# Patient Record
Sex: Female | Born: 1985 | Race: Black or African American | Hispanic: No | Smoking: Former smoker
Health system: Southern US, Community
[De-identification: ages and names within clinical notes are randomized; demographics above are authoritative.]

## PROBLEM LIST (undated history)

## (undated) DIAGNOSIS — D649 Anemia, unspecified: Secondary | ICD-10-CM

## (undated) DIAGNOSIS — F329 Major depressive disorder, single episode, unspecified: Secondary | ICD-10-CM

## (undated) DIAGNOSIS — R000 Tachycardia, unspecified: Secondary | ICD-10-CM

## (undated) DIAGNOSIS — O24419 Gestational diabetes mellitus in pregnancy, unspecified control: Secondary | ICD-10-CM

## (undated) DIAGNOSIS — F419 Anxiety disorder, unspecified: Secondary | ICD-10-CM

## (undated) DIAGNOSIS — D473 Essential (hemorrhagic) thrombocythemia: Secondary | ICD-10-CM

## (undated) DIAGNOSIS — F32A Depression, unspecified: Secondary | ICD-10-CM

## (undated) DIAGNOSIS — R7303 Prediabetes: Secondary | ICD-10-CM

## (undated) HISTORY — DX: Major depressive disorder, single episode, unspecified: F32.9

## (undated) HISTORY — PX: WISDOM TOOTH EXTRACTION: SHX21

## (undated) HISTORY — DX: Essential (hemorrhagic) thrombocythemia: D47.3

## (undated) HISTORY — DX: Anemia, unspecified: D64.9

## (undated) HISTORY — DX: Depression, unspecified: F32.A

## (undated) HISTORY — DX: Anxiety disorder, unspecified: F41.9

---

## 2006-01-08 ENCOUNTER — Encounter: Admission: RE | Admit: 2006-01-08 | Discharge: 2006-01-08 | Payer: Self-pay | Admitting: Family Medicine

## 2006-08-30 ENCOUNTER — Emergency Department (HOSPITAL_COMMUNITY): Admission: EM | Admit: 2006-08-30 | Discharge: 2006-08-30 | Payer: Self-pay | Admitting: Emergency Medicine

## 2006-12-16 ENCOUNTER — Other Ambulatory Visit: Admission: RE | Admit: 2006-12-16 | Discharge: 2006-12-16 | Payer: Self-pay | Admitting: Obstetrics and Gynecology

## 2007-05-12 ENCOUNTER — Ambulatory Visit (HOSPITAL_COMMUNITY): Admission: RE | Admit: 2007-05-12 | Discharge: 2007-05-12 | Payer: Self-pay | Admitting: Obstetrics and Gynecology

## 2010-08-19 ENCOUNTER — Other Ambulatory Visit: Admission: RE | Admit: 2010-08-19 | Discharge: 2010-08-19 | Payer: Self-pay | Admitting: Family Medicine

## 2011-01-29 ENCOUNTER — Emergency Department (HOSPITAL_COMMUNITY)
Admission: EM | Admit: 2011-01-29 | Discharge: 2011-01-29 | Discharge status: Against Medical Advice | Payer: BC Managed Care – PPO | Attending: Emergency Medicine | Admitting: Emergency Medicine

## 2011-01-29 DIAGNOSIS — R220 Localized swelling, mass and lump, head: Secondary | ICD-10-CM | POA: Insufficient documentation

## 2011-01-29 DIAGNOSIS — R221 Localized swelling, mass and lump, neck: Secondary | ICD-10-CM | POA: Insufficient documentation

## 2011-01-29 DIAGNOSIS — K089 Disorder of teeth and supporting structures, unspecified: Secondary | ICD-10-CM | POA: Insufficient documentation

## 2013-10-31 ENCOUNTER — Other Ambulatory Visit: Payer: Self-pay | Admitting: Physician Assistant

## 2013-10-31 ENCOUNTER — Other Ambulatory Visit (HOSPITAL_COMMUNITY)
Admission: RE | Admit: 2013-10-31 | Discharge: 2013-10-31 | Disposition: A | Discharge status: Home / Self Care | Payer: BC Managed Care – PPO | Source: Ambulatory Visit | Attending: Family Medicine | Admitting: Family Medicine

## 2013-10-31 DIAGNOSIS — Z124 Encounter for screening for malignant neoplasm of cervix: Secondary | ICD-10-CM | POA: Insufficient documentation

## 2013-10-31 DIAGNOSIS — N63 Unspecified lump in unspecified breast: Secondary | ICD-10-CM

## 2013-11-03 ENCOUNTER — Other Ambulatory Visit: Payer: BC Managed Care – PPO

## 2013-11-07 ENCOUNTER — Other Ambulatory Visit: Payer: BC Managed Care – PPO

## 2013-11-15 ENCOUNTER — Other Ambulatory Visit: Payer: BC Managed Care – PPO

## 2015-02-27 ENCOUNTER — Encounter (HOSPITAL_BASED_OUTPATIENT_CLINIC_OR_DEPARTMENT_OTHER): Payer: Self-pay | Admitting: *Deleted

## 2015-02-27 ENCOUNTER — Emergency Department (HOSPITAL_BASED_OUTPATIENT_CLINIC_OR_DEPARTMENT_OTHER)
Admission: EM | Admit: 2015-02-27 | Discharge: 2015-02-27 | Disposition: A | Discharge status: Home / Self Care | Payer: BC Managed Care – PPO | Attending: Emergency Medicine | Admitting: Emergency Medicine

## 2015-02-27 DIAGNOSIS — L259 Unspecified contact dermatitis, unspecified cause: Secondary | ICD-10-CM | POA: Insufficient documentation

## 2015-02-27 DIAGNOSIS — Z792 Long term (current) use of antibiotics: Secondary | ICD-10-CM | POA: Insufficient documentation

## 2015-02-27 MED ORDER — DEXAMETHASONE SODIUM PHOSPHATE 10 MG/ML IJ SOLN
10.0000 mg | Freq: Once | INTRAMUSCULAR | Status: AC
  Administered 2015-02-27: 10 mg via INTRAMUSCULAR
  Filled 2015-02-27: qty 1

## 2015-02-27 NOTE — Discharge Instructions (Signed)
Contact Dermatitis °Contact dermatitis is a rash that happens when something touches the skin. You touched something that irritates your skin, or you have allergies to something you touched. °HOME CARE  °· Avoid the thing that caused your rash. °· Keep your rash away from hot water, soap, sunlight, chemicals, and other things that might bother it. °· Do not scratch your rash. °· You can take cool baths to help stop itching. °· Only take medicine as told by your doctor. °· Keep all doctor visits as told. °GET HELP RIGHT AWAY IF:  °· Your rash is not better after 3 days. °· Your rash gets worse. °· Your rash is puffy (swollen), tender, red, sore, or warm. °· You have problems with your medicine. °MAKE SURE YOU:  °· Understand these instructions. °· Will watch your condition. °· Will get help right away if you are not doing well or get worse. °Document Released: 08/31/2009 Document Revised: 01/26/2012 Document Reviewed: 04/08/2011 °ExitCare® Patient Information ©2015 [employer]. This information is not intended to replace advice given to you by your health care provider. Make sure you discuss any questions you have with your health care provider. ° °

## 2015-02-27 NOTE — ED Notes (Signed)
Pt reports she used restroom at her place of work this morning after it had been cleaned- area of skin that came in contact with toilet seat broke out in rash, redness, and burning- pt consulted her PCP and went home and showered as directed but skin is still burning

## 2015-02-27 NOTE — ED Provider Notes (Signed)
CSN: [id number]     Arrival date & time 02/27/15  2023 History  This chart was scribed for Orpah Greek, MD by Tula Nakayama, ED Scribe. This patient was seen in room MH12/MH12 and the patient's care was started at 8:52 PM.    Chief Complaint  Patient presents with  . Chemical Exposure   The history is provided by the patient. No language interpreter was used.    HPI Comments: Kathryn Velazquez is a 29 y.o. female who presents to the Emergency Department complaining of a constant, moderate, red, burning and itching rash on her buttocks that started this morning. Pt reports that onset of symptoms started after she used the restroom at work and was exposed to the cleaning chemicals on the toilet. Pt tried showering and Benadryl with some relief to the itching. She denies numbness.  History reviewed. No pertinent past medical history. Past Surgical History  Procedure Laterality Date  . Cesarean section     No family history on file. History  Substance Use Topics  . Smoking status: Never Smoker   . Smokeless tobacco: Never Used  . Alcohol Use: Yes     Comment: rare   OB History    No data available     Review of Systems  Skin: Positive for rash.  Neurological: Negative for numbness.  All other systems reviewed and are negative.   Allergies  Review of patient's allergies indicates no known allergies.  Home Medications   Prior to Admission medications   Medication Sig Start Date End Date Taking? Authorizing Provider  amoxicillin (AMOXIL) 500 MG tablet Take 500 mg by mouth 3 (three) times daily.   Yes Historical Provider, MD   BP 149/83 mmHg  Pulse 99  Temp(Src) 98.3 F (36.8 C) (Oral)  Resp 16  Ht 5\' 1"  (1.549 m)  Wt 240 lb (108.863 kg)  BMI 45.37 kg/m2  SpO2 100%  LMP 02/04/2015 Physical Exam  Constitutional: She is oriented to person, place, and time. She appears well-developed and well-nourished. No distress.  HENT:  Head: Normocephalic and atraumatic.   Right Ear: Hearing normal.  Left Ear: Hearing normal.  Nose: Nose normal.  Mouth/Throat: Oropharynx is clear and moist and mucous membranes are normal.  Eyes: Conjunctivae and EOM are normal. Pupils are equal, round, and reactive to light.  Neck: Normal range of motion. Neck supple.  Cardiovascular: Regular rhythm, S1 normal and S2 normal.  Exam reveals no gallop and no friction rub.   No murmur heard. Pulmonary/Chest: Effort normal and breath sounds normal. No respiratory distress. She exhibits no tenderness.  Abdominal: Soft. Normal appearance and bowel sounds are normal. There is no hepatosplenomegaly. There is no tenderness. There is no rebound, no guarding, no tenderness at McBurney's point and negative Murphy's sign. No hernia.  Musculoskeletal: Normal range of motion.  Neurological: She is alert and oriented to person, place, and time. She has normal strength. No cranial nerve deficit or sensory deficit. Coordination normal. GCS eye subscore is 4. GCS verbal subscore is 5. GCS motor subscore is 6.  Skin: Skin is warm, dry and intact. No rash noted. No cyanosis.  Psychiatric: She has a normal mood and affect. Her speech is normal and behavior is normal. Thought content normal.  Nursing note and vitals reviewed.   ED Course  Procedures   DIAGNOSTIC STUDIES: Oxygen Saturation is 100% on RA, normal by my interpretation.    COORDINATION OF CARE: 8:55 PM Discussed treatment plan with pt which includes a Decadron  injection. Pt agreed to plan.   Labs Review Labs Reviewed - No data to display  Imaging Review No results found.   EKG Interpretation None      MDM   Final diagnoses:  None   Patient presents to the ER for evaluation of exposure to a cleaning product. Patient reports that she has been experiencing itching and burning on the skin and attached the toilet seat that she set on at work earlier that had cleaning solution on it. Her skin examination, however, is normal.  I do not see any evidence of chemical burns in no outward signs of allergic reaction. Patient was reassured. She can continue Benadryl and was given Decadron IM for treatment of possible mild allergic reaction.  I personally performed the services described in this documentation, which was scribed in my presence. The recorded information has been reviewed and is accurate.    Orpah Greek, MD 02/27/15 2107

## 2016-03-12 ENCOUNTER — Other Ambulatory Visit: Payer: Self-pay | Admitting: Physician Assistant

## 2016-03-12 ENCOUNTER — Other Ambulatory Visit (HOSPITAL_COMMUNITY)
Admission: RE | Admit: 2016-03-12 | Discharge: 2016-03-12 | Disposition: A | Discharge status: Home / Self Care | Payer: BC Managed Care – PPO | Source: Ambulatory Visit | Attending: Family Medicine | Admitting: Family Medicine

## 2016-03-12 DIAGNOSIS — Z124 Encounter for screening for malignant neoplasm of cervix: Secondary | ICD-10-CM | POA: Diagnosis not present

## 2016-03-12 DIAGNOSIS — N6310 Unspecified lump in the right breast, unspecified quadrant: Secondary | ICD-10-CM

## 2016-03-13 LAB — HM PAP SMEAR: HM Pap smear: NEGATIVE

## 2016-03-17 LAB — CYTOLOGY - PAP

## 2016-03-18 ENCOUNTER — Inpatient Hospital Stay: Admission: RE | Admit: 2016-03-18 | Payer: Self-pay | Source: Ambulatory Visit

## 2016-03-31 ENCOUNTER — Other Ambulatory Visit: Payer: Self-pay

## 2016-12-11 ENCOUNTER — Ambulatory Visit
Admission: RE | Admit: 2016-12-11 | Discharge: 2016-12-11 | Disposition: A | Discharge status: Home / Self Care | Payer: BC Managed Care – PPO | Source: Ambulatory Visit | Attending: Physician Assistant | Admitting: Physician Assistant

## 2016-12-11 ENCOUNTER — Other Ambulatory Visit: Payer: Self-pay | Admitting: Physician Assistant

## 2016-12-11 DIAGNOSIS — N6310 Unspecified lump in the right breast, unspecified quadrant: Secondary | ICD-10-CM

## 2016-12-15 ENCOUNTER — Other Ambulatory Visit: Payer: Self-pay | Admitting: Physician Assistant

## 2016-12-15 ENCOUNTER — Inpatient Hospital Stay: Admission: RE | Admit: 2016-12-15 | Payer: Self-pay | Source: Ambulatory Visit

## 2016-12-15 DIAGNOSIS — N6310 Unspecified lump in the right breast, unspecified quadrant: Secondary | ICD-10-CM

## 2016-12-26 ENCOUNTER — Ambulatory Visit
Admission: RE | Admit: 2016-12-26 | Discharge: 2016-12-26 | Disposition: A | Discharge status: Home / Self Care | Payer: BC Managed Care – PPO | Source: Ambulatory Visit | Attending: Physician Assistant | Admitting: Physician Assistant

## 2016-12-26 DIAGNOSIS — N6310 Unspecified lump in the right breast, unspecified quadrant: Secondary | ICD-10-CM

## 2017-02-19 ENCOUNTER — Emergency Department (HOSPITAL_COMMUNITY)
Admission: EM | Admit: 2017-02-19 | Discharge: 2017-02-19 | Disposition: A | Discharge status: Home / Self Care | Payer: BC Managed Care – PPO | Attending: Emergency Medicine | Admitting: Emergency Medicine

## 2017-02-19 ENCOUNTER — Encounter (HOSPITAL_COMMUNITY): Payer: Self-pay | Admitting: Emergency Medicine

## 2017-02-19 DIAGNOSIS — T7840XA Allergy, unspecified, initial encounter: Secondary | ICD-10-CM | POA: Diagnosis present

## 2017-02-19 DIAGNOSIS — R21 Rash and other nonspecific skin eruption: Secondary | ICD-10-CM | POA: Diagnosis not present

## 2017-02-19 MED ORDER — DIPHENHYDRAMINE HCL 50 MG/ML IJ SOLN
25.0000 mg | Freq: Once | INTRAMUSCULAR | Status: AC
  Administered 2017-02-19: 25 mg via INTRAVENOUS
  Filled 2017-02-19: qty 1

## 2017-02-19 MED ORDER — DEXAMETHASONE SODIUM PHOSPHATE 10 MG/ML IJ SOLN
10.0000 mg | Freq: Once | INTRAMUSCULAR | Status: AC
  Administered 2017-02-19: 10 mg via INTRAVENOUS
  Filled 2017-02-19: qty 1

## 2017-02-19 MED ORDER — FAMOTIDINE IN NACL 20-0.9 MG/50ML-% IV SOLN
20.0000 mg | Freq: Once | INTRAVENOUS | Status: AC
  Administered 2017-02-19: 20 mg via INTRAVENOUS
  Filled 2017-02-19: qty 50

## 2017-02-19 MED ORDER — FAMOTIDINE 20 MG PO TABS: 20.0000 mg | ORAL_TABLET | Freq: Two times a day (BID) | ORAL | 0 refills | Status: DC

## 2017-02-19 MED ORDER — EPINEPHRINE 0.3 MG/0.3ML IJ SOAJ: 0.3000 mg | Freq: Once | INTRAMUSCULAR | 2 refills | Status: AC

## 2017-02-19 MED ORDER — PREDNISONE 50 MG PO TABS: 50.0000 mg | ORAL_TABLET | Freq: Every day | ORAL | 0 refills | Status: DC

## 2017-02-19 MED ORDER — SODIUM CHLORIDE 0.9 % IV BOLUS (SEPSIS)
1000.0000 mL | Freq: Once | INTRAVENOUS | Status: AC
  Administered 2017-02-19: 1000 mL via INTRAVENOUS

## 2017-02-19 NOTE — ED Notes (Signed)
Reassed vitals.Pt stated her throat was sore and it was becoming difficult to swallow.  RN notified and Pt taken to triage room to be reassessed.

## 2017-02-19 NOTE — ED Notes (Signed)
Pt reports continued throat discomfort, reports tightness to both hands, hives noted to arms and legs.

## 2017-02-19 NOTE — Discharge Instructions (Signed)
Return here for any worsening in your condition.  If you start having any throat tightening, tongue swelling, you will need to return to the emergency department

## 2017-02-19 NOTE — ED Triage Notes (Signed)
Pt st's she woke up yesterday with hives on her body.  St's today she went to her MD and received a shot of steroids and benadryl.  Pt st's after getting home her lips began to swell and hives became worse.  No resp distress at this time

## 2017-02-25 NOTE — ED Provider Notes (Signed)
[facility] DEPT Provider Note   CSN: [id number] Arrival date & time: 02/19/17  1551     History   Chief Complaint Chief Complaint  Patient presents with  . Allergic Reaction    HPI Kathryn Velazquez is a 31 y.o. female.  HPI Patient presents to the emergency department with hives on her legs and arms and chest.  The patient states that she went to her doctor, received Benadryl and a shot of steroids.  Patient states that she noticed that her lips and hands were swelling.  The patient states that she did not have any trouble breathing or difficulty swallowing.  The patient states nothing seems make the condition better or worse. The patient denies chest pain, shortness of breath, headache,blurred vision, neck pain, fever, cough, weakness, numbness, dizziness, anorexia, edema, abdominal pain, nausea, vomiting, diarrhea, rash, back pain, dysuria, hematemesis, bloody stool, near syncope, or syncope. History reviewed. No pertinent past medical history.  There are no active problems to display for this patient.   Past Surgical History:  Procedure Laterality Date  . CESAREAN SECTION      OB History    No data available       Home Medications    Prior to Admission medications   Medication Sig Start Date End Date Taking? Authorizing Provider  Acetaminophen-Caffeine (EXCEDRIN TENSION HEADACHE) 500-65 MG TABS Take 1 tablet by mouth daily as needed (HEADACHE).   Yes Historical Provider, MD  diphenhydrAMINE (BENADRYL) 25 MG tablet Take 50 mg by mouth every 6 (six) hours as needed for allergies.    Yes Historical Provider, MD  escitalopram (LEXAPRO) 10 MG tablet Take 10 mg by mouth daily. 02/03/17  Yes Historical Provider, MD  naproxen sodium (ANAPROX) 220 MG tablet Take 220 mg by mouth 2 (two) times daily as needed (FOR PAIN).   Yes Historical Provider, MD  famotidine (PEPCID) 20 MG tablet Take 1 tablet (20 mg total) by mouth 2 (two) times daily. 02/19/17   Dalia Heading, PA-C    predniSONE (DELTASONE) 50 MG tablet Take 1 tablet (50 mg total) by mouth daily. 02/19/17   Dalia Heading, PA-C    Family History No family history on file.  Social History Social History  Substance Use Topics  . Smoking status: Never Smoker  . Smokeless tobacco: Never Used  . Alcohol use Yes     Comment: rare     Allergies   Patient has no known allergies.   Review of Systems Review of Systems  All other systems negative except as documented in the HPI. All pertinent positives and negatives as reviewed in the HPI. Physical Exam Updated Vital Signs BP (!) 108/50   Pulse 80   Temp 97.8 F (36.6 C) (Oral)   Resp 20   Ht 5\' 1"  (1.549 m)   Wt 101.2 kg   LMP 02/15/2017 (Exact Date)   SpO2 100%   BMI 42.14 kg/m   Physical Exam  Constitutional: She is oriented to person, place, and time. She appears well-developed and well-nourished. No distress.  HENT:  Head: Normocephalic and atraumatic.    Mouth/Throat: Oropharynx is clear and moist.  Eyes: Pupils are equal, round, and reactive to light.  Neck: Normal range of motion. Neck supple.  Cardiovascular: Normal rate, regular rhythm and normal heart sounds.  Exam reveals no gallop and no friction rub.   No murmur heard. Pulmonary/Chest: Effort normal and breath sounds normal. No respiratory distress. She has no wheezes.  Abdominal: Soft. Bowel sounds are normal. She exhibits  no distension. There is no tenderness.  Neurological: She is alert and oriented to person, place, and time. She exhibits normal muscle tone. Coordination normal.  Skin: Skin is warm and dry. Capillary refill takes less than 2 seconds. No rash noted. No erythema.  Psychiatric: She has a normal mood and affect. Her behavior is normal.  Nursing note and vitals reviewed.    ED Treatments / Results  Labs (all labs ordered are listed, but only abnormal results are displayed) Labs Reviewed - No data to display  EKG  EKG Interpretation None        Radiology No results found.  Procedures Procedures (including critical care time)  Medications Ordered in ED Medications  sodium chloride 0.9 % bolus 1,000 mL (0 mLs Intravenous Stopped 02/19/17 2203)  diphenhydrAMINE (BENADRYL) injection 25 mg (25 mg Intravenous Given 02/19/17 1850)  famotidine (PEPCID) IVPB 20 mg premix (0 mg Intravenous Stopped 02/19/17 2203)  dexamethasone (DECADRON) injection 10 mg (10 mg Intravenous Given 02/19/17 1904)     Initial Impression / Assessment and Plan / ED Course  I have reviewed the triage vital signs and the nursing notes.  Pertinent labs & imaging results that were available during my care of the patient were reviewed by me and considered in my medical decision making (see chart for details).     Patient has been observed over many hours here in the emergency department.  She is improved her hives did not completely resolve but it mostly resolved.  Advised the patient the plan and all questions were answered.  Patient lip swelling has improved as well.  Her hands are nonswollen  Final Clinical Impressions(s) / ED Diagnoses   Final diagnoses:  Allergic reaction, initial encounter  Rash    New Prescriptions Discharge Medication List as of 02/19/2017 11:19 PM    START taking these medications   Details  EPINEPHrine 0.3 mg/0.3 mL IJ SOAJ injection Inject 0.3 mLs (0.3 mg total) into the muscle once., Starting Thu 02/19/2017, Print    famotidine (PEPCID) 20 MG tablet Take 1 tablet (20 mg total) by mouth 2 (two) times daily., Starting Thu 02/19/2017, Print    predniSONE (DELTASONE) 50 MG tablet Take 1 tablet (50 mg total) by mouth daily., Starting Thu 02/19/2017, Print         Dalia Heading, PA-C 02/25/17 [place], MD 02/26/17 0130

## 2018-06-17 ENCOUNTER — Encounter: Payer: Self-pay | Admitting: Family Medicine

## 2018-06-17 ENCOUNTER — Ambulatory Visit: Payer: BC Managed Care – PPO | Admitting: Family Medicine

## 2018-06-17 VITALS — BP 110/70 | HR 73 | Ht 62.0 in | Wt 250.8 lb

## 2018-06-17 DIAGNOSIS — Z7689 Persons encountering health services in other specified circumstances: Secondary | ICD-10-CM | POA: Diagnosis not present

## 2018-06-17 DIAGNOSIS — F329 Major depressive disorder, single episode, unspecified: Secondary | ICD-10-CM | POA: Diagnosis not present

## 2018-06-17 DIAGNOSIS — F419 Anxiety disorder, unspecified: Secondary | ICD-10-CM | POA: Diagnosis not present

## 2018-06-17 DIAGNOSIS — F32A Depression, unspecified: Secondary | ICD-10-CM

## 2018-06-17 DIAGNOSIS — N63 Unspecified lump in unspecified breast: Secondary | ICD-10-CM

## 2018-06-17 NOTE — Patient Instructions (Signed)
You can call to schedule your appointment with the psychiatrist. A few offices are listed below for you to call.    [facility]  [address] [place], [place], [place] [address]  Phone: [phone]  [facility] [address] [place], [place] [address]  Phone: [phone]

## 2018-06-17 NOTE — Progress Notes (Signed)
   Subjective:    Patient ID: Kathryn Velazquez, female    DOB: 02/16/1986, 32 y.o.   MRN: [id number]  HPI Chief Complaint  Patient presents with  . new pt concerns    new pt, aneixty off and on since 2012-2013, and has a lump in breast- right - had this for 10-12 years. has to go every year for mammogram, last year had biospy of this.    She is new to the practice and here to establish care. She has multiple concerns.  Previous medical care: [facility] the [facility]. Dr. Barrie Folk for the past 4-5 years.  Last CPE: 1 year or so ago.   Other providers: Dr. Hassell [facility]  [facility]. Has been going consistently.  Dentist - [facility]   States she saw a psychiatrist in the past and was diagnosed with bipolar illness. She reports feeling skeptical about this diagnosis so she did not follow up.   Concerns today that her anxiety and depression is getting worse. States she feels more manic than depressed.  States she occasionally goes on shopping sprees and has a hard time controlling this. This is affecting her financially.  Stopped anxiety medication in November. She was taking Lexapro.for about a year and wasn't sure if it was helping. Stopped it abruptly. No medications since then. No side effects.   Has taken Pristiq and other medications in the past.  States she thinks she needs to be on medication.   Denies SI or HI.   Reports having a lump in her right breast for the past 10-12 years. Had a biopsy in 12/2016. States she was told to follow up but did not. Would like to follow up now. Denies any changes to mass or breast tissue.   Denies any new symptoms.   Denies fever, chills, dizziness, chest pain, palpitations, shortness of breath, abdominal pain, N/V/D, urinary symptoms, LE edema.   Social history: Lives with her son who is age 40, works as a [employer].   Last Menstrual cycle: 06/16/2018   Reviewed allergies, medications, past medical,  surgical, family, and social history.   Review of Systems Pertinent positives and negatives in the history of present illness.     Objective:   Physical Exam BP 110/70   Pulse 73   Ht 5\' 2"  (1.575 m)   Wt 250 lb 12.8 oz (113.8 kg)   LMP 06/16/2018   BMI 45.87 kg/m   Alert and oriented and in no acute distress. Not examined otherwise.       Assessment & Plan:  Anxiety and depression  Breast mass - Plan: US BREAST LTD UNI RIGHT INC AXILLA  Encounter to establish care  Recommend that she see a new psychiatrist or licensed psychologist. She appears to have a complex mental health history. She does not appear to be in any danger to herself or others.  No medications prescribed today.  She will call and schedule with the breast center for follow up of chronic right breast mass.  Will get records from [facility] and follow up as needed.

## 2018-07-01 ENCOUNTER — Ambulatory Visit
Admission: RE | Admit: 2018-07-01 | Discharge: 2018-07-01 | Disposition: A | Discharge status: Home / Self Care | Payer: BC Managed Care – PPO | Source: Ambulatory Visit | Attending: Family Medicine | Admitting: Family Medicine

## 2018-07-01 ENCOUNTER — Telehealth: Payer: Self-pay | Admitting: Family Medicine

## 2018-07-01 ENCOUNTER — Other Ambulatory Visit: Payer: Self-pay | Admitting: Family Medicine

## 2018-07-01 DIAGNOSIS — N63 Unspecified lump in unspecified breast: Secondary | ICD-10-CM

## 2018-07-01 NOTE — Telephone Encounter (Signed)
Received requested records from [facility] at [facility]. Included in records are pap and mammogram. Sending back for review.

## 2018-08-06 ENCOUNTER — Ambulatory Visit: Payer: BC Managed Care – PPO | Admitting: Medical

## 2018-08-06 ENCOUNTER — Encounter: Payer: Self-pay | Admitting: Medical

## 2018-08-06 VITALS — BP 112/70 | HR 67 | Temp 97.8°F | Resp 16 | Ht 62.0 in | Wt 246.0 lb

## 2018-08-06 DIAGNOSIS — R05 Cough: Secondary | ICD-10-CM

## 2018-08-06 DIAGNOSIS — H109 Unspecified conjunctivitis: Secondary | ICD-10-CM

## 2018-08-06 DIAGNOSIS — J00 Acute nasopharyngitis [common cold]: Secondary | ICD-10-CM | POA: Diagnosis not present

## 2018-08-06 DIAGNOSIS — R059 Cough, unspecified: Secondary | ICD-10-CM

## 2018-08-06 MED ORDER — POLYMYXIN B-TRIMETHOPRIM 10000-0.1 UNIT/ML-% OP SOLN: 1.0000 [drp] | OPHTHALMIC | 0 refills | Status: DC

## 2018-08-06 NOTE — Progress Notes (Signed)
  Subjective: Kathryn Velazquez is a 32 y.o. female who presents for possible pink eye.  She notes 1 day hx/o redness, crusting, irritation of left eye.  Coworker had pink eye.    Woke up with crusting this morning, but also has watery drainage.  Has some pus in corners of left eye.   No fever.  Has some cough and sore throat, but has allergie issues as well.   Some sneezing.   Not currently taking allergy medication.   No recent activity to cause debris in eye.   No other aggravating or relieving factors.  No other c/o.  No past medical history on file.  ROS as in subjective   Objective: BP 112/70   Pulse 67   Temp 97.8 F (36.6 C) (Oral)   Resp 16   Ht 5\' 2"  (1.575 m)   Wt 246 lb (111.6 kg)   SpO2 99%   BMI 44.99 kg/m   General appearance: alert, no distress Currently bilateral conjunctiva and eyes appear normal, no swelling no discharge no crusting PERRLA, EOMi, eyes otherwise unremarkable  Ears: TMs pearly, canals normal Nares patent, no discharge or erythema Pharynx normal, tonsils unremarkable Oral cavity: MMM, no lesions Neck: supple, no lymphadenopathy, no thyromegaly, no masses      Assessment  Encounter Diagnoses  Name Primary?  . Conjunctivitis of left eye, unspecified conjunctivitis type Yes  . Acute rhinitis   . Cough       Plan: Exam is pretty unremarkable today. She has some symptoms that would suggest mild allergies, so I did recommend she begin an over-the-counter allergy tablet daily for the next couple weeks particular if the symptoms persist There is a possibility of very early pinkeye.  We discussed the recommendations below, prescription given for use if the symptoms do gradually worsen over the next 48 hours  Discussed diagnosis of conjunctivitis/pink eye.  Advised that pink eye is very contagious and spreads by direct contact.  Discussed treatment including moist warm compresses, antibiotic drops, avoid rubbing eyes, do not wear contact lenses or  makeup until infection is resolved.  Discussed prevention, hand washing, not rubbing eyes.    Patient was advised to call or return if worse or not improving in the next few days.    Patient voiced understanding of diagnosis, recommendations, and treatment plan.  After visit summary given.   [name] was seen today for pink eye.  Diagnoses and all orders for this visit:  Conjunctivitis of left eye, unspecified conjunctivitis type  Acute rhinitis  Cough  Other orders -     trimethoprim-polymyxin b (POLYTRIM) ophthalmic solution; Place 1 drop into the left eye every 4 (four) hours.

## 2018-08-19 ENCOUNTER — Encounter: Payer: Self-pay | Admitting: Family Medicine

## 2018-08-27 ENCOUNTER — Ambulatory Visit (INDEPENDENT_AMBULATORY_CARE_PROVIDER_SITE_OTHER): Payer: BC Managed Care – PPO | Admitting: Psychiatry

## 2018-08-27 ENCOUNTER — Encounter: Payer: Self-pay | Admitting: Psychiatry

## 2018-08-27 VITALS — BP 151/83 | HR 93 | Ht 62.0 in

## 2018-08-27 DIAGNOSIS — F3181 Bipolar II disorder: Secondary | ICD-10-CM

## 2018-08-27 DIAGNOSIS — F419 Anxiety disorder, unspecified: Secondary | ICD-10-CM

## 2018-08-27 MED ORDER — LAMOTRIGINE 25 MG PO TABS: ORAL_TABLET | ORAL | 1 refills | Status: DC

## 2018-08-27 NOTE — Progress Notes (Signed)
[facility] MD/PA/NP Initial Note  08/29/2018 9:53 PM Kathryn Velazquez  MRN:  [id number]  Chief Complaint:  Chief Complaint    Anxiety; Depression    Mood Disturbance, Anxiety "At the end of the day I just want a consistent mood."   HPI: Patient is a 32 year old female seen for initial evaluation for mood disturbance and anxiety.  She reports that she took anxiety medication in the past until November of last year and reports that she is currently having anxiety. Reports that she has been dx' d with Bipolar D/O and reports that she is "very impulsive."  Reports anxiety will vary in frequency and in response to stressors. Reports occasionally her thoughts will race and thought content is anxious. Has had periods of hives for no apparent reason and not sure if it was caused by stress or anxiety. Reports that she feels "jittery" today. Reports about a year ago she had obsessive thoughts about death and saw a therapist. Reports that she will have obsessive thoughts. Reports that she has had some catastrophic and intrusive thoughts. Reports that she tends to worry more about hypothetical situations or future scenarios instead of day to day stressors. Reports that she will have intrusive thoughts about having disease or health issue. She will avoid health testing due to being afraid they may find something else. Denies any compulsive behaviors related to this such as handwashing or sanitizing. Denies any compulsive counting or checking behaviors. Reports that once she starts a routine, she will have to continue it, such as stopping at the same restaurant and having to go there every morning and order the same thing regardless of if she wants the food. Denies any routines or rituals before leaving home. Reports that she will not sit down in the evening until lunches are packed, certain things are put away, etc. Denies any h/o panic attacks. Reports occasional startle response. Denies consistent hypervigilance.  Denies any h/o abuse or trauma.  Reports that others comment that she has periods of manic s/s with increased talkativeness. Reports that she has periods of wanting to shop a lot. Has increased energy at times and directs it towards her shopping. Sometimes feels that she will not be able to relax until she goes out to buy something. Has sometimes had issues with online shopping. Reports that shopping has improved compared to the past and in the past would spend her entire check. Denies shopping when mood is more down. Denies any impulsivity around sex or relationships. Reports that she has impulsive thoughts, such as starting a business, and will then decide not to follow through. Reports that she will spontaneously take trips. Describes sleep as "light" when mood is elevated and may wake up and look up things on the internet and tries to avoid anything that is going to keep her. Denies elevated mood.   She reports that she has irritable moods and "I don't have a lot of patience... I am more consistently irritable."  Reports "I feel like I have a pretty consistent mood."   That she usually goes to bed around 10 pm and sleeps until 5:30 am. Reports that her appetite is "regular" and denies fluctuations in appetite with mood changes. Reports energy is "fine." Motivation varies and "depends on the day." Infrequent issues with motivation. Denies low motivation for work. Concentration varies. Reports that she recalls a day last week when she could not make herself focus. She reports most of the time she is able to focus ok. More easily  distracted when mood is elevated. Denies current or past SI.   Reports some periods of persistent sad mood where she is more withdrawn. Reports that she will typically pull herself out of a low mood after a day. Reports that most weeks her mood is ok and not fluctuating. 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 Reports that she has had more depressive s/s than  elevated moods. Reports frequent irritability.   Denies current or past AH, VH, or paranoia.  Visit Diagnosis:    ICD-10-CM   1. Bipolar II disorder (HCC) F31.81 DISCONTINUED: lamoTRIgine (LAMICTAL) 25 MG tablet  2. Other mixed anxiety disorders F41.3     Past Psychiatric History:  Denies past psychiatric hospitalization. Saw a therapist from January to July. "I go to therapy off and on." Reports seeing different therapists in the past. Saw Dr. Candis Schatz in 2013 and reports that she was prescribed Depakote and did not take it.  Past Medication Trials: Lexapro- Reports that she did not see a significant difference (negative or positive). Reports taking it for most of the year. Pristiq- took only briefly due to insurance/cost. "I think I liked it." Depakote- Prescribed but never took.  Lamictal- Prescribed but never took. Xanax- Took in 2013 or 2014. "It's not a practical everyday medicine." May have taken 1 mg.   Past Medical History: History reviewed. No pertinent past medical history.  Past Surgical History:  Procedure Laterality Date  . CESAREAN SECTION      Family Psychiatric History: See Below  Family History:  Family History  Problem Relation Age of Onset  . Aneurysm Mother   . Alcohol abuse Mother   . Hypertension Father   . Anxiety disorder Sister     Social History: Born and raised in [place], [place]. Has one sister and 3 brothers and she is the second to the youngest. Reports mother was alcoholic most of pt's childhood until mother had an aneurysm in 2006. Reports father "basically raised me." Has a bachelor's degree in education. Works as an Electronics engineer. Single, never been married. Not currently in a relationship. Has an 16 yo son and a 70 yo foster son who has been with her since June. Fostering for the last 5 years. She reports that she does not have any hobbies or interests.    Social History   Socioeconomic History  . Marital status: Single    Spouse  name: Not on file  . Number of children: Not on file  . Years of education: Not on file  . Highest education level: Not on file  Occupational History  . Not on file  Social Needs  . Financial resource strain: Not on file  . Food insecurity:    Worry: Not on file    Inability: Not on file  . Transportation needs:    Medical: Not on file    Non-medical: Not on file  Tobacco Use  . Smoking status: Former Smoker    Types: Cigarettes  . Smokeless tobacco: Never Used  . Tobacco comment: twice a month  Substance and Sexual Activity  . Alcohol use: Yes    Comment: rare  . Drug use: No  . Sexual activity: Yes    Birth control/protection: None  Lifestyle  . Physical activity:    Days per week: Not on file    Minutes per session: Not on file  . Stress: Not on file  Relationships  . Social connections:    Talks on phone:  Not on file    Gets together: Not on file    Attends religious service: Not on file    Active member of club or organization: Not on file    Attends meetings of clubs or organizations: Not on file    Relationship status: Not on file  Other Topics Concern  . Not on file  Social History Narrative  . Not on file    Allergies: No Known Allergies  Metabolic Disorder Labs: No results found for: HGBA1C, MPG No results found for: PROLACTIN No results found for: CHOL, TRIG, HDL, CHOLHDL, VLDL, LDLCALC No results found for: TSH  Therapeutic Level Labs: No results found for: LITHIUM No results found for: VALPROATE No components found for:  CBMZ  Current Medications: No current outpatient medications on file.   No current facility-administered medications for this visit.    Medication Side Effects: None  Orders placed this visit:  No orders of the defined types were placed in this encounter.   Psychiatric Specialty Exam: Review of Systems  Constitutional: Negative.   HENT: Negative.   Eyes: Negative.   Respiratory: Negative.   Cardiovascular:  Negative.   Gastrointestinal: Negative for diarrhea, nausea and vomiting.  Genitourinary: Negative.   Musculoskeletal: Negative.   Skin: Negative.   Neurological: Negative for dizziness and headaches.  Psychiatric/Behavioral: Positive for depression. Negative for substance abuse and suicidal ideas. The patient is nervous/anxious.     Blood pressure (!) 151/83, pulse 93, height 5\' 2"  (1.575 m).Body mass index is 44.99 kg/m.  General Appearance: Casual  Eye Contact:  Good  Speech:  Clear and Coherent and Normal Rate  Volume:  Normal  Mood:  Anxious  Affect:  Constricted  Thought Process:  Coherent  Orientation:  Full (Time, Place, and Person)  Thought Content: Logical   Suicidal Thoughts:  No  Homicidal Thoughts:  No  Memory:  Immediate  Judgement:  Good  Insight:  Good  Psychomotor Activity:  Normal  Concentration:  Concentration: Good  Recall:  Good  Fund of Knowledge: Good  Language: Good  Akathisia:  NA  AIMS (if indicated): not done  Assets:  Communication Skills Desire for Improvement Vocational/Educational  ADL's:  Intact  Cognition: WNL  Prognosis:  Good   Screenings:  GAD-7     Office Visit from 06/17/2018 in [facility]  Total GAD-7 Score  12    PHQ2-9     Office Visit from 06/17/2018 in [facility]  PHQ-2 Total Score  6  PHQ-9 Total Score  18       Treatment Plan/Recommendations: Patient seen for 60 minutes and greater than 50% of exam spent counseling patient regarding mood and anxiety signs and symptoms.  Discussed that based on patient's history she has experienced chronic mood lability that has been unimproved on Lexapro and minimally improved with Pristiq, and therefore recommend initiating a mood stabilizer. Counseled patient regarding potential benefits, risks, and side effects of Lamictal to include potential risk of Stevens-Johnson syndrome. Advised patient to stop taking Lamictal and contact office immediately if she develops  a rash and to seek urgent medical attention if rash is severe and/or spreading quickly.  Will start Lamictal 25 mg daily for 2 weeks, then increase to 50 mg daily for 2 weeks for mood stabilization.  Patient advised to contact office with any questions or concerns.  Patient to follow-up with this provider in 4 weeks or sooner if clinically indicated.   Thayer Headings, PMHNP

## 2018-09-01 ENCOUNTER — Encounter: Payer: Self-pay | Admitting: Internal Medicine

## 2018-11-09 ENCOUNTER — Ambulatory Visit: Payer: BC Managed Care – PPO | Admitting: Family Medicine

## 2018-11-09 ENCOUNTER — Encounter: Payer: Self-pay | Admitting: Family Medicine

## 2018-11-09 VITALS — BP 120/70 | HR 82 | Ht 62.0 in | Wt 240.6 lb

## 2018-11-09 DIAGNOSIS — Z6841 Body Mass Index (BMI) 40.0 and over, adult: Secondary | ICD-10-CM | POA: Insufficient documentation

## 2018-11-09 DIAGNOSIS — F32A Depression, unspecified: Secondary | ICD-10-CM

## 2018-11-09 DIAGNOSIS — Z0000 Encounter for general adult medical examination without abnormal findings: Secondary | ICD-10-CM | POA: Diagnosis not present

## 2018-11-09 DIAGNOSIS — Z833 Family history of diabetes mellitus: Secondary | ICD-10-CM

## 2018-11-09 DIAGNOSIS — F419 Anxiety disorder, unspecified: Secondary | ICD-10-CM

## 2018-11-09 DIAGNOSIS — Z23 Encounter for immunization: Secondary | ICD-10-CM

## 2018-11-09 DIAGNOSIS — Z1329 Encounter for screening for other suspected endocrine disorder: Secondary | ICD-10-CM

## 2018-11-09 DIAGNOSIS — F329 Major depressive disorder, single episode, unspecified: Secondary | ICD-10-CM

## 2018-11-09 DIAGNOSIS — Z13220 Encounter for screening for lipoid disorders: Secondary | ICD-10-CM

## 2018-11-09 LAB — POCT URINALYSIS DIP (PROADVANTAGE DEVICE)
Bilirubin, UA: NEGATIVE
Blood, UA: NEGATIVE
Glucose, UA: NEGATIVE mg/dL
Ketones, POC UA: NEGATIVE mg/dL
Leukocytes, UA: NEGATIVE
Nitrite, UA: NEGATIVE
Protein Ur, POC: NEGATIVE mg/dL
Specific Gravity, Urine: 1.03
Urobilinogen, Ur: NEGATIVE
pH, UA: 6 (ref 5.0–8.0)

## 2018-11-09 NOTE — Patient Instructions (Signed)
Preventative Care for Adults - Female      MAINTAIN REGULAR HEALTH EXAMS:  A routine yearly physical is a good way to check in with your primary care provider about your health and preventive screening. It is also an opportunity to share updates about your health and any concerns you have, and receive a thorough all-over exam.   Most health insurance companies pay for at least some preventative services.  Check with your health plan for specific coverages.  WHAT PREVENTATIVE SERVICES DO WOMEN NEED?  Adult women should have their weight and blood pressure checked regularly.   Women age 53 and older should have their cholesterol levels checked regularly.  Women should be screened for cervical cancer with a Pap smear and pelvic exam beginning at age 46.  Breast cancer screening generally begins at age 6 with a mammogram and breast exam by your primary care provider.    Beginning at age 71 and continuing to age 55, women should be screened for colorectal cancer.  Certain people may need continued testing until age 9.  Updating vaccinations is part of preventative care.  Vaccinations help protect against diseases such as the flu.  Osteoporosis is a disease in which the bones lose minerals and strength as we age. Women ages 71 and over should discuss this with their caregivers, as should women after menopause who have other risk factors.  Lab tests are generally done as part of preventative care to screen for anemia and blood disorders, to screen for problems with the kidneys and liver, to screen for bladder problems, to check blood sugar, and to check your cholesterol level.  Preventative services generally include counseling about diet, exercise, avoiding tobacco, drugs, excessive alcohol consumption, and sexually transmitted infections.    GENERAL RECOMMENDATIONS FOR GOOD HEALTH:  Healthy diet:  Eat a variety of foods, including fruit, vegetables, animal or vegetable protein, such as  meat, fish, chicken, and eggs, or beans, lentils, tofu, and grains, such as rice.  Drink plenty of water daily.  Decrease saturated fat in the diet, avoid lots of red meat, processed foods, sweets, fast foods, and fried foods.  Exercise:  Aerobic exercise helps maintain good heart health. At least 30-40 minutes of moderate-intensity exercise is recommended. For example, a brisk walk that increases your heart rate and breathing. This should be done on most days of the week.   Find a type of exercise or a variety of exercises that you enjoy so that it becomes a part of your daily life.  Examples are running, walking, swimming, water aerobics, and biking.  For motivation and support, explore group exercise such as aerobic class, spin class, Zumba, Yoga,or  martial arts, etc.    Set exercise goals for yourself, such as a certain weight goal, walk or run in a race such as a 5k walk/run.  Speak to your primary care provider about exercise goals.  Disease prevention:  If you smoke or chew tobacco, find out from your caregiver how to quit. It can literally save your life, no matter how long you have been a tobacco user. If you do not use tobacco, never begin.   Maintain a healthy diet and normal weight. Increased weight leads to problems with blood pressure and diabetes.   The Body Mass Index or BMI is a way of measuring how much of your body is fat. Having a BMI above 27 increases the risk of heart disease, diabetes, hypertension, stroke and other problems related to obesity. Your caregiver  can help determine your BMI and based on it develop an exercise and dietary program to help you achieve or maintain this important measurement at a healthful level.  High blood pressure causes heart and blood vessel problems.  Persistent high blood pressure should be treated with medicine if weight loss and exercise do not work.   Fat and cholesterol leaves deposits in your arteries that can block them. This  causes heart disease and vessel disease elsewhere in your body.  If your cholesterol is found to be high, or if you have heart disease or certain other medical conditions, then you may need to have your cholesterol monitored frequently and be treated with medication.   Ask if you should have a cardiac stress test if your history suggests this. A stress test is a test done on a treadmill that looks for heart disease. This test can find disease prior to there being a problem.  Menopause can be associated with physical symptoms and risks. Hormone replacement therapy is available to decrease these. You should talk to your caregiver about whether starting or continuing to take hormones is right for you.   Osteoporosis is a disease in which the bones lose minerals and strength as we age. This can result in serious bone fractures. Risk of osteoporosis can be identified using a bone density scan. Women ages 27 and over should discuss this with their caregivers, as should women after menopause who have other risk factors. Ask your caregiver whether you should be taking a calcium supplement and Vitamin D, to reduce the rate of osteoporosis.   Avoid drinking alcohol in excess (more than two drinks per day).  Avoid use of street drugs. Do not share needles with anyone. Ask for professional help if you need assistance or instructions on stopping the use of alcohol, cigarettes, and/or drugs.  Brush your teeth twice a day with fluoride toothpaste, and floss once a day. Good oral hygiene prevents tooth decay and gum disease. The problems can be painful, unattractive, and can cause other health problems. Visit your dentist for a routine oral and dental check up and preventive care every 6-12 months.   Look at your skin regularly.  Use a mirror to look at your back. Notify your caregivers of changes in moles, especially if there are changes in shapes, colors, a size larger than a pencil eraser, an irregular border, or  development of new moles.  Safety:  Use seatbelts 100% of the time, whether driving or as a passenger.  Use safety devices such as hearing protection if you work in environments with loud noise or significant background noise.  Use safety glasses when doing any work that could send debris in to the eyes.  Use a helmet if you ride a bike or motorcycle.  Use appropriate safety gear for contact sports.  Talk to your caregiver about gun safety.  Use sunscreen with a SPF (or skin protection factor) of 15 or greater.  Lighter skinned people are at a greater risk of skin cancer. Don't forget to also wear sunglasses in order to protect your eyes from too much damaging sunlight. Damaging sunlight can accelerate cataract formation.   Practice safe sex. Use condoms. Condoms are used for birth control and to help reduce the spread of sexually transmitted infections (or STIs).  Some of the STIs are gonorrhea (the clap), chlamydia, syphilis, trichomonas, herpes, HPV (human papilloma virus) and HIV (human immunodeficiency virus) which causes AIDS. The herpes, HIV and HPV are viral illnesses  that have no cure. These can result in disability, cancer and death.   Keep carbon monoxide and smoke detectors in your home functioning at all times. Change the batteries every 6 months or use a model that plugs into the wall.   Vaccinations:  Stay up to date with your tetanus shots and other required immunizations. You should have a booster for tetanus every 10 years. Be sure to get your flu shot every year, since 5%-20% of the [place] population comes down with the flu. The flu vaccine changes each year, so being vaccinated once is not enough. Get your shot in the fall, before the flu season peaks.   Other vaccines to consider:  Human Papilloma Virus or HPV causes cancer of the cervix, and other infections that can be transmitted from person to person. There is a vaccine for HPV, and females should get immunized between the  ages of 10 and 23. It requires a series of 3 shots.   Pneumococcal vaccine to protect against certain types of pneumonia.  This is normally recommended for adults age 66 or older.  However, adults younger than 31 years old with certain underlying conditions such as diabetes, heart or lung disease should also receive the vaccine.  Shingles vaccine to protect against Varicella Zoster if you are older than age 58, or younger than 31 years old with certain underlying illness.  Hepatitis A vaccine to protect against a form of infection of the liver by a virus acquired from food.  Hepatitis B vaccine to protect against a form of infection of the liver by a virus acquired from blood or body fluids, particularly if you work in health care.  If you plan to travel internationally, check with your local health department for specific vaccination recommendations.  Cancer Screening:  Breast cancer screening is essential to preventive care for women. All women age 92 and older should perform a breast self-exam every month. At age 77 and older, women should have their caregiver complete a breast exam each year. Women at ages 19 and older should have a mammogram (x-ray film) of the breasts. Your caregiver can discuss how often you need mammograms.    Cervical cancer screening includes taking a Pap smear (sample of cells examined under a microscope) from the cervix (end of the uterus). It also includes testing for HPV (Human Papilloma Virus, which can cause cervical cancer). Screening and a pelvic exam should begin at age 72, or 3 years after a woman becomes sexually active. Screening should occur every year, with a Pap smear but no HPV testing, up to age 61. After age 48, you should have a Pap smear every 3 years with HPV testing, if no HPV was found previously.   Most routine colon cancer screening begins at the age of 36. On a yearly basis, doctors may provide special easy to use take-home tests to check for  hidden blood in the stool. Sigmoidoscopy or colonoscopy can detect the earliest forms of colon cancer and is life saving. These tests use a small camera at the end of a tube to directly examine the colon. Speak to your caregiver about this at age 16, when routine screening begins (and is repeated every 5 years unless early forms of pre-cancerous polyps or small growths are found).

## 2018-11-09 NOTE — Progress Notes (Signed)
Subjective:    Patient ID: Kathryn Velazquez, female    DOB: 1985/12/06, 32 y.o.   MRN: [id number]  HPI Chief Complaint  Patient presents with  . fasting cpe    fasting cpe   She is fairly new to the practice and here for a complete physical exam. Previous medical care: [facility]  Last CPE: 2018  Other providers: Dr. Hassell [facility]  [facility]. Has been going consistently.  Dentist - [facility]    Social history: Lives with her 31 year old boy, works at [employer] some days, light. Rarely drinks alcohol, drug use  Diet: poor. Eats a lot of sweets and eats often  Excerise: none   Immunizations: Tdap unknown. Declines flu shot   Health maintenance:  Mammogram: breast US and biopsy on right breast 2018 and benign  Colonoscopy: N/A Last Gynecological Exam: pap smear 02/2016. Negative and due in 2020  Last Menstrual cycle: 10/19/2018 Declines STD testing. Is not sexually active  Last Dental Exam: 06/2018 Last Eye Exam: 1 year ago   Wears seatbelt always, uses sunscreen, smoke detectors in home and functioning, does not text while driving and feels safe in home environment.   Reviewed allergies, medications, past medical, surgical, family, and social history.     Review of Systems Review of Systems Constitutional: -fever, -chills, -sweats, -unexpected weight change,-fatigue ENT: -runny nose, -ear pain, -sore throat Cardiology:  -chest pain, -palpitations, -edema Respiratory: -cough, -shortness of breath, -wheezing Gastroenterology: -abdominal pain, -nausea, -vomiting, -diarrhea, -constipation  Hematology: -bleeding or bruising problems Musculoskeletal: -arthralgias, -myalgias, -joint swelling, -back pain Ophthalmology: -vision changes Urology: -dysuria, -difficulty urinating, -hematuria, -urinary frequency, -urgency Neurology: -headache, -weakness, -tingling, -numbness       Objective:   Physical Exam BP 120/70    Pulse 82   Ht 5\' 2"  (1.575 m)   Wt 240 lb 9.6 oz (109.1 kg)   LMP 10/19/2018   BMI 44.01 kg/m   General Appearance:    Alert, cooperative, no distress, appears stated age  Head:    Normocephalic, without obvious abnormality, atraumatic  Eyes:    PERRL, conjunctiva/corneas clear, EOM's intact, fundi    benign  Ears:    Normal TM's and external ear canals  Nose:   Nares normal, mucosa normal, no drainage or sinus   tenderness  Throat:   Lips, mucosa, and tongue normal; teeth and gums normal  Neck:   Supple, no lymphadenopathy;  thyroid:  no   enlargement/tenderness/nodules; no carotid   bruit or JVD  Back:    Spine nontender, no curvature, ROM normal, no CVA     tenderness  Lungs:     Clear to auscultation bilaterally without wheezes, rales or     ronchi; respirations unlabored  Chest Wall:    No tenderness or deformity   Heart:    Regular rate and rhythm, S1 and S2 normal, no murmur, rub   or gallop  Breast Exam:   Declines.  Recent mammogram and ultrasound.  Abdomen:     Soft, non-tender, nondistended, normoactive bowel sounds,    no masses, no hepatosplenomegaly  Genitalia:   Declines.  Pap smear is UTD  Rectal:    Not performed due to age<40 and no related complaints  Extremities:   No clubbing, cyanosis or edema  Pulses:   2+ and symmetric all extremities  Skin:   Skin color, texture, turgor normal, no rashes or lesions  Lymph nodes:   Cervical, supraclavicular, and axillary  nodes normal  Neurologic:   CNII-XII intact, normal strength, sensation and gait; reflexes 2+ and symmetric throughout          Psych:   Normal mood, affect, hygiene and grooming.     Urinalysis dipstick: Negative      Assessment & Plan:  Routine general medical examination at a health care facility - Plan: POCT Urinalysis DIP (Proadvantage Device), CBC with Differential/Platelet, Comprehensive metabolic panel, TSH, T4, free, Lipid panel  Anxiety and depression  Morbid obesity ([place]) - Plan: TSH, T4,  free, Hemoglobin A1c, Lipid panel  Need for diphtheria-tetanus-pertussis (Tdap) vaccine - Plan: Tdap vaccine greater than or equal to 7yo IM  Screening for thyroid disorder - Plan: TSH, T4, free  Screening for lipid disorders - Plan: Lipid panel  Family history of diabetes mellitus in brother - Plan: Hemoglobin A1c  She appears to be doing well overall physically and emotionally. Counseling done on severe obesity and potential long-term health consequences associated. Screening for diabetes.  She has a brother with diabetes. Counseling done on healthy diet and lifestyle. Discussed safety. Recent breast ultrasound showing probable benign mass in the right breast.  She will have a follow-up ultrasound in February. Tdap given.  Counseling done on all components of the vaccine.  She declines the flu shot. Pap smear in 2017 and negative.  Denies history of abnormal Pap smears.  She will be due next year Declines STD testing.  Is not currently sexually active. She is fasting and would like lipid panel. Follow-up pending labs.

## 2018-11-10 ENCOUNTER — Encounter: Payer: Self-pay | Admitting: Family Medicine

## 2018-11-10 DIAGNOSIS — D75839 Thrombocytosis, unspecified: Secondary | ICD-10-CM

## 2018-11-10 DIAGNOSIS — D473 Essential (hemorrhagic) thrombocythemia: Secondary | ICD-10-CM | POA: Insufficient documentation

## 2018-11-10 DIAGNOSIS — D649 Anemia, unspecified: Secondary | ICD-10-CM

## 2018-11-10 HISTORY — DX: Anemia, unspecified: D64.9

## 2018-11-10 HISTORY — DX: Thrombocytosis, unspecified: D75.839

## 2018-11-10 LAB — COMPREHENSIVE METABOLIC PANEL
ALT: 9 IU/L (ref 0–32)
AST: 14 IU/L (ref 0–40)
Albumin/Globulin Ratio: 1.3 (ref 1.2–2.2)
Albumin: 4.2 g/dL (ref 3.5–5.5)
Alkaline Phosphatase: 119 IU/L — ABNORMAL HIGH (ref 39–117)
BUN/Creatinine Ratio: 16 (ref 9–23)
BUN: 12 mg/dL (ref 6–20)
Bilirubin Total: 0.3 mg/dL (ref 0.0–1.2)
CO2: 21 mmol/L (ref 20–29)
Calcium: 9.5 mg/dL (ref 8.7–10.2)
Chloride: 102 mmol/L (ref 96–106)
Creatinine, Ser: 0.75 mg/dL (ref 0.57–1.00)
GFR calc Af Amer: 123 mL/min/{1.73_m2} (ref >59)
GFR calc non Af Amer: 107 mL/min/{1.73_m2} (ref >59)
Globulin, Total: 3.2 g/dL (ref 1.5–4.5)
Glucose: 92 mg/dL (ref 65–99)
Potassium: 4.2 mmol/L (ref 3.5–5.2)
Sodium: 140 mmol/L (ref 134–144)
Total Protein: 7.4 g/dL (ref 6.0–8.5)

## 2018-11-10 LAB — HEMOGLOBIN A1C
Est. average glucose Bld gHb Est-mCnc: 108 mg/dL
Hgb A1c MFr Bld: 5.4 % (ref 4.8–5.6)

## 2018-11-10 LAB — CBC WITH DIFFERENTIAL/PLATELET
Basophils Absolute: 0 10*3/uL (ref 0.0–0.2)
Basos: 1 %
EOS (ABSOLUTE): 0.2 10*3/uL (ref 0.0–0.4)
Eos: 2 %
Hematocrit: 33 % — ABNORMAL LOW (ref 34.0–46.6)
Hemoglobin: 10.2 g/dL — ABNORMAL LOW (ref 11.1–15.9)
Immature Grans (Abs): 0 10*3/uL (ref 0.0–0.1)
Immature Granulocytes: 0 %
Lymphocytes Absolute: 2.1 10*3/uL (ref 0.7–3.1)
Lymphs: 27 %
MCH: 20.6 pg — ABNORMAL LOW (ref 26.6–33.0)
MCHC: 30.9 g/dL — ABNORMAL LOW (ref 31.5–35.7)
MCV: 67 fL — ABNORMAL LOW (ref 79–97)
Monocytes Absolute: 0.7 10*3/uL (ref 0.1–0.9)
Monocytes: 9 %
Neutrophils Absolute: 4.7 10*3/uL (ref 1.4–7.0)
Neutrophils: 61 %
Platelets: 565 10*3/uL — ABNORMAL HIGH (ref 150–450)
RBC: 4.95 x10E6/uL (ref 3.77–5.28)
RDW: 16.8 % — ABNORMAL HIGH (ref 12.3–15.4)
WBC: 7.7 10*3/uL (ref 3.4–10.8)

## 2018-11-10 LAB — LIPID PANEL
Chol/HDL Ratio: 3.8 ratio (ref 0.0–4.4)
Cholesterol, Total: 147 mg/dL (ref 100–199)
HDL: 39 mg/dL — ABNORMAL LOW (ref >39)
LDL Calculated: 101 mg/dL — ABNORMAL HIGH (ref 0–99)
Triglycerides: 36 mg/dL (ref 0–149)
VLDL Cholesterol Cal: 7 mg/dL (ref 5–40)

## 2018-11-10 LAB — TSH: TSH: 1.7 u[IU]/mL (ref 0.450–4.500)

## 2018-11-10 LAB — T4, FREE: Free T4: 1.17 ng/dL (ref 0.82–1.77)

## 2018-11-11 ENCOUNTER — Other Ambulatory Visit: Payer: Self-pay | Admitting: Family Medicine

## 2018-11-11 DIAGNOSIS — D75839 Thrombocytosis, unspecified: Secondary | ICD-10-CM

## 2018-11-11 DIAGNOSIS — D473 Essential (hemorrhagic) thrombocythemia: Secondary | ICD-10-CM

## 2018-11-11 DIAGNOSIS — D509 Iron deficiency anemia, unspecified: Secondary | ICD-10-CM

## 2018-11-12 LAB — FERRITIN: Ferritin: 15 ng/mL (ref 15–150)

## 2018-11-12 LAB — IRON AND TIBC
Iron Saturation: 7 % — CL (ref 15–55)
Iron: 26 ug/dL — ABNORMAL LOW (ref 27–159)
Total Iron Binding Capacity: 357 ug/dL (ref 250–450)
UIBC: 331 ug/dL (ref 131–425)

## 2018-11-12 LAB — SPECIMEN STATUS REPORT

## 2019-01-03 ENCOUNTER — Other Ambulatory Visit: Payer: BC Managed Care – PPO

## 2019-01-19 ENCOUNTER — Ambulatory Visit: Payer: BC Managed Care – PPO | Admitting: Psychiatry

## 2019-01-19 ENCOUNTER — Encounter: Payer: Self-pay | Admitting: Psychiatry

## 2019-01-19 VITALS — BP 148/93

## 2019-01-19 DIAGNOSIS — F3181 Bipolar II disorder: Secondary | ICD-10-CM

## 2019-01-19 DIAGNOSIS — F419 Anxiety disorder, unspecified: Secondary | ICD-10-CM

## 2019-01-19 MED ORDER — FLUOXETINE HCL 10 MG PO CAPS: ORAL_CAPSULE | ORAL | 1 refills | Status: DC

## 2019-01-19 MED ORDER — PROPRANOLOL HCL 10 MG PO TABS: ORAL_TABLET | ORAL | 0 refills | Status: DC

## 2019-01-19 NOTE — Progress Notes (Signed)
Kathryn Velazquez [id number] 10/24/1986 33 y.o.  Subjective:   Patient ID:  Kathryn Velazquez is a 33 y.o. (DOB Feb 14, 1986) female.  Chief Complaint:  Chief Complaint  Patient presents with  . Anxiety    HPI [name] presents to the office today for follow-up of anxiety and mood disturbance  She denies any changes with low dose Lamictal. She reports that she has had severe anxiety recently. She reports "obsessive worry about everything. Right now it's about the carona virus." Reports that she has had some rumination and at times has been sleeping excessively and trying to avoid these thoughts. She reports that her thoughts go to "getting sick and dying" and that this has been a constant worry. She reports that she has cancelled trips due to anxiety (fear of flying, contracting a disease, etc.). She reports that at times she has "a panicked feeling in my chest" and will talk quickly when anxious. Reports increased heart rate at times.  Reports that she has had some catastrophic thinking. Reports that her son is now exhibiting anxiety and feels "I am making him like me with this constant worry." She reports that she has not had a full blown panic attack. She describes "being in my head" and internally pre-occupied. She reports that she has periods of increased anxiety and then anxiety will totally resolve.   Reports that she has difficulty determining "which comes first" in terms of anxiety and mood. She reports that she has not noticed irritability as much lately and has been able to "coach" herself to come out of irritable moods when they occur. She reports sad moods recently and that sad mood will last a few days and "then I just get over it." She reports adequate sleep at night. No change in appetite. She reports that her motivation has been low. She reports that energy is consistent with baseline. She reports that concentration is "fine... sometimes I can't concentrate that great." Reports  that it takes her different amounts of time to complete some tasks. Denies SI.   Denies risky or impulsive behavior. Denies any recent elevated moods. Denies any increased talkativeness. Denies any recent hypomanic episodes.   Continues to foster and there is some discussion about adoption since foster child may have parental rights may be terminated.   Past Medication Trials: Lexapro- Reports that she did not see a significant difference (negative or positive). Reports taking it for most of the year. Pristiq- took only briefly due to insurance/cost. "I think I liked it." Depakote- Prescribed but never took.  Lamictal- Prescribed but never took. Xanax- Took in 2013 or 2014. "It's not a practical everyday medicine." May have taken 1 mg.   Review of Systems:  Review of Systems  Gastrointestinal: Negative.   Musculoskeletal: Negative for gait problem.  Neurological: Positive for headaches. Negative for tremors.  Psychiatric/Behavioral:       Please refer to HPI    Medications: I have reviewed the patient's current medications.  Current Outpatient Medications  Medication Sig Dispense Refill  . ferrous sulfate 325 (65 FE) MG EC tablet Take 325 mg by mouth 3 (three) times daily with meals.    Marland Kitchen FLUoxetine (PROZAC) 10 MG capsule Take 1 capsule po qd x 1 week, then increase to 2 capsules po qd 60 capsule 1  . propranolol (INDERAL) 10 MG tablet Take 1-2 tabs po BID prn anxiety 120 tablet 0   No current facility-administered medications for this visit.     Medication Side Effects: Other: N/A  Allergies: No Known Allergies  Past Medical History:  Diagnosis Date  . Anemia 11/10/2018  . Anxiety and depression   . Thrombocytosis ([place]) 11/10/2018    Family History  Problem Relation Age of Onset  . Aneurysm Mother   . Alcohol abuse Mother   . Hypertension Father   . Diabetes Brother     Social History   Socioeconomic History  . Marital status: Single    Spouse name: Not on  file  . Number of children: Not on file  . Years of education: Not on file  . Highest education level: Not on file  Occupational History  . Not on file  Social Needs  . Financial resource strain: Not on file  . Food insecurity:    Worry: Not on file    Inability: Not on file  . Transportation needs:    Medical: Not on file    Non-medical: Not on file  Tobacco Use  . Smoking status: Current Some Day Smoker    Types: Cigarettes  . Smokeless tobacco: Never Used  . Tobacco comment: twice a month  Substance and Sexual Activity  . Alcohol use: Yes    Comment: rare  . Drug use: No  . Sexual activity: Not Currently    Birth control/protection: None  Lifestyle  . Physical activity:    Days per week: Not on file    Minutes per session: Not on file  . Stress: Not on file  Relationships  . Social connections:    Talks on phone: Not on file    Gets together: Not on file    Attends religious service: Not on file    Active member of club or organization: Not on file    Attends meetings of clubs or organizations: Not on file    Relationship status: Not on file  . Intimate partner violence:    Fear of current or ex partner: Not on file    Emotionally abused: Not on file    Physically abused: Not on file    Forced sexual activity: Not on file  Other Topics Concern  . Not on file  Social History Narrative  . Not on file    Past Medical History, Surgical history, Social history, and Family history were reviewed and updated as appropriate.   Please see review of systems for further details on the patient's review from today.   Objective:   Physical Exam:  BP (!) 148/93   Physical Exam Constitutional:      General: She is not in acute distress.    Appearance: She is well-developed.  Musculoskeletal:        General: No deformity.  Neurological:     Mental Status: She is alert and oriented to person, place, and time.     Coordination: Coordination normal.  Psychiatric:         Attention and Perception: Attention and perception normal. She does not perceive auditory or visual hallucinations.        Mood and Affect: Mood is anxious and depressed. Affect is not labile, blunt, angry or inappropriate.        Speech: Speech normal.        Behavior: Behavior normal.        Thought Content: Thought content normal. Thought content does not include homicidal or suicidal ideation. Thought content does not include homicidal or suicidal plan.        Cognition and Memory: Cognition and memory normal.  Judgment: Judgment normal.     Comments: Insight intact. No delusions.      Lab Review:     Component Value Date/Time   NA 140 11/09/2018 0920   K 4.2 11/09/2018 0920   CL 102 11/09/2018 0920   CO2 21 11/09/2018 0920   GLUCOSE 92 11/09/2018 0920   BUN 12 11/09/2018 0920   CREATININE 0.75 11/09/2018 0920   CALCIUM 9.5 11/09/2018 0920   PROT 7.4 11/09/2018 0920   ALBUMIN 4.2 11/09/2018 0920   AST 14 11/09/2018 0920   ALT 9 11/09/2018 0920   ALKPHOS 119 (H) 11/09/2018 0920   BILITOT 0.3 11/09/2018 0920   GFRNONAA 107 11/09/2018 0920   GFRAA 123 11/09/2018 0920       Component Value Date/Time   WBC 7.7 11/09/2018 0920   RBC 4.95 11/09/2018 0920   HGB 10.2 (L) 11/09/2018 0920   HCT 33.0 (L) 11/09/2018 0920   PLT 565 (H) 11/09/2018 0920   MCV 67 (L) 11/09/2018 0920   MCH 20.6 (L) 11/09/2018 0920   MCHC 30.9 (L) 11/09/2018 0920   RDW 16.8 (H) 11/09/2018 0920   LYMPHSABS 2.1 11/09/2018 0920   EOSABS 0.2 11/09/2018 0920   BASOSABS 0.0 11/09/2018 0920    No results found for: POCLITH, LITHIUM   No results found for: PHENYTOIN, PHENOBARB, VALPROATE, CBMZ   .res Assessment: Plan:   Patient seen for 30 minutes and greater than 50% of visit spent counseling patient regarding potential benefits, risk, and side effects of possible treatment options to include BuSpar, propanolol, and Prozac.  Patient reports that her sister has had a good response to Prozac  for her anxiety signs and symptoms and patient ask about starting Prozac as well.  Discussed potential risk of Prozac precipitating manic signs and symptoms and advised patient to contact office if she notices any mood lability, irritability, insomnia, impulsivity, or excessive spending.  Discussed starting low-dose and that further titration may be needed.  Will start with Prozac 10 mg daily for 1 week, then increase to 20 mg daily for anxiety and depression.  We will also start propanolol 10 mg 1-2 tabs p.o. twice daily as needed anxiety.  Patient to follow-up in 4 to 6 weeks or sooner if clinically indicated.Patient advised to contact office with any questions, adverse effects, or acute worsening in signs and symptoms.  Anxiety disorder, unspecified type - Plan: propranolol (INDERAL) 10 MG tablet, FLUoxetine (PROZAC) 10 MG capsule  Bipolar II disorder ([facility]) - Plan: FLUoxetine (PROZAC) 10 MG capsule  Please see After Visit Summary for patient specific instructions.  Future Appointments  Date Time Provider [place]  02/21/2019  2:00 PM Thayer Headings, PMHNP [facility] None    No orders of the defined types were placed in this encounter.     -------------------------------

## 2019-02-04 ENCOUNTER — Ambulatory Visit: Payer: Self-pay | Admitting: Family Medicine

## 2019-02-21 ENCOUNTER — Ambulatory Visit: Payer: BC Managed Care – PPO | Admitting: Psychiatry

## 2019-03-20 ENCOUNTER — Other Ambulatory Visit: Payer: Self-pay | Admitting: Psychiatry

## 2019-03-20 DIAGNOSIS — F3181 Bipolar II disorder: Secondary | ICD-10-CM

## 2019-03-20 DIAGNOSIS — F419 Anxiety disorder, unspecified: Secondary | ICD-10-CM

## 2019-07-05 ENCOUNTER — Other Ambulatory Visit: Payer: BC Managed Care – PPO

## 2019-07-12 ENCOUNTER — Ambulatory Visit: Payer: BC Managed Care – PPO | Admitting: Psychiatry

## 2019-07-12 ENCOUNTER — Ambulatory Visit: Payer: BC Managed Care – PPO | Admitting: Family Medicine

## 2019-07-15 ENCOUNTER — Encounter: Payer: Self-pay | Admitting: Psychiatry

## 2019-07-15 ENCOUNTER — Other Ambulatory Visit: Payer: Self-pay

## 2019-07-15 ENCOUNTER — Ambulatory Visit (INDEPENDENT_AMBULATORY_CARE_PROVIDER_SITE_OTHER): Payer: BC Managed Care – PPO | Admitting: Psychiatry

## 2019-07-15 DIAGNOSIS — F419 Anxiety disorder, unspecified: Secondary | ICD-10-CM | POA: Diagnosis not present

## 2019-07-15 DIAGNOSIS — F3181 Bipolar II disorder: Secondary | ICD-10-CM | POA: Diagnosis not present

## 2019-07-15 MED ORDER — LATUDA 20 MG PO TABS: 20.0000 mg | ORAL_TABLET | Freq: Every day | ORAL | 0 refills | Status: DC

## 2019-07-15 MED ORDER — LURASIDONE HCL 40 MG PO TABS: 40.0000 mg | ORAL_TABLET | Freq: Every day | ORAL | 0 refills | Status: DC

## 2019-07-15 NOTE — Progress Notes (Signed)
Ambera Conk [id number] 02-08-1986 33 y.o.  Subjective:   Patient ID:  Kathryn Velazquez is a 33 y.o. (DOB 05-Jun-1986) female.  Chief Complaint:  Chief Complaint  Patient presents with  . Other    Mood lability, mixed episode s/s  . Anxiety    HPI Kathryn Velazquez presents to the office today for follow-up of mood and anxiety.  She reports that she had severe insomnia for several weeks and then periods of sleeping excessively. Reports that she has had some impuslivity- spending, "bad life choices," drinking more. She reports that she and her family have noticed impulsivity and that she is doing things that are not typical for her.  Has noticed more irritability and dysphoric mood. "On good days, good energy and motivation." Reports that she was staying up at night, cleaning and/or shopping in the middle of the night. More talkative and interested in sex during those times.   Reports that mood was elevated for a few months and on "a downward spiral" for about the last week. She describes abrupt changes in mood- "I wake up and it's just different."  She reports that her memory has not been as good as usual. She reports that she is having to write everything down and needing reminders. Has had some word finding difficulties. Appetite has been inconsistent and will sometimes forget to eat. Denies SI.   She reports that anxiety has been better the last few weeks. She reports a month or so ago anxiety was worse and she would awaken with panic s/s and feel as if her heart is racing despite having a normal pulse.   Denies AH or VH.   She reports that she was dx'd with Bipolar D/O in 2013.   Past Medication Trials: Lexapro- Reports that she did not see a significant difference (negative or positive). Reports taking it for most of the year. Prozac Pristiq- took only briefly due to insurance/cost. "I think I liked it." Depakote- Prescribed but never took.  Lamictal- Prescribed but never  took. Xanax- Took in 2013 or 2014. "It's not a practical everyday medicine." May have taken 1 mg. Propranolol  Review of Systems:  Review of Systems  Musculoskeletal: Negative for gait problem.  Neurological: Positive for tremors.  Psychiatric/Behavioral:       Please refer to HPI    Medications: I have reviewed the patient's current medications.  Current Outpatient Medications  Medication Sig Dispense Refill  . ferrous sulfate 325 (65 FE) MG EC tablet Take 325 mg by mouth 3 (three) times daily with meals.    Marland Kitchen lurasidone (LATUDA) 20 MG TABS tablet Take 1 tablet (20 mg total) by mouth daily with supper for 7 days. 7 tablet 0  . [START ON 07/22/2019] lurasidone (LATUDA) 40 MG TABS tablet Take 1 tablet (40 mg total) by mouth daily with breakfast. 28 tablet 0   No current facility-administered medications for this visit.     Medication Side Effects: Other: N/A  Allergies: No Known Allergies  Past Medical History:  Diagnosis Date  . Anemia 11/10/2018  . Anxiety and depression   . Thrombocytosis ([place]) 11/10/2018    Family History  Problem Relation Age of Onset  . Aneurysm Mother   . Alcohol abuse Mother   . Hypertension Father   . Diabetes Brother     Social History   Socioeconomic History  . Marital status: Single    Spouse name: Not on file  . Number of children: Not on file  . Years of  education: Not on file  . Highest education level: Not on file  Occupational History  . Not on file  Social Needs  . Financial resource strain: Not on file  . Food insecurity    Worry: Not on file    Inability: Not on file  . Transportation needs    Medical: Not on file    Non-medical: Not on file  Tobacco Use  . Smoking status: Current Some Day Smoker    Types: Cigarettes  . Smokeless tobacco: Never Used  . Tobacco comment: twice a month  Substance and Sexual Activity  . Alcohol use: Yes    Comment: rare  . Drug use: No  . Sexual activity: Not Currently    Birth  control/protection: None  Lifestyle  . Physical activity    Days per week: Not on file    Minutes per session: Not on file  . Stress: Not on file  Relationships  . Social Herbalist on phone: Not on file    Gets together: Not on file    Attends religious service: Not on file    Active member of club or organization: Not on file    Attends meetings of clubs or organizations: Not on file    Relationship status: Not on file  . Intimate partner violence    Fear of current or ex partner: Not on file    Emotionally abused: Not on file    Physically abused: Not on file    Forced sexual activity: Not on file  Other Topics Concern  . Not on file  Social History Narrative  . Not on file    Past Medical History, Surgical history, Social history, and Family history were reviewed and updated as appropriate.   Please see review of systems for further details on the patient's review from today.   Objective:   Physical Exam:  There were no vitals taken for this visit.  Physical Exam Constitutional:      General: She is not in acute distress.    Appearance: She is well-developed.  Musculoskeletal:        General: No deformity.  Neurological:     Mental Status: She is alert and oriented to person, place, and time.     Coordination: Coordination normal.  Psychiatric:        Attention and Perception: Attention and perception normal. She does not perceive auditory or visual hallucinations.        Mood and Affect: Mood is depressed. Mood is not anxious. Affect is not labile, blunt, angry or inappropriate.        Speech: Speech normal.        Behavior: Behavior normal.        Thought Content: Thought content normal. Thought content does not include homicidal or suicidal ideation. Thought content does not include homicidal or suicidal plan.        Cognition and Memory: Cognition and memory normal.        Judgment: Judgment normal.     Comments: Insight intact. No delusions.       Lab Review:     Component Value Date/Time   NA 140 11/09/2018 0920   K 4.2 11/09/2018 0920   CL 102 11/09/2018 0920   CO2 21 11/09/2018 0920   GLUCOSE 92 11/09/2018 0920   BUN 12 11/09/2018 0920   CREATININE 0.75 11/09/2018 0920   CALCIUM 9.5 11/09/2018 0920   PROT 7.4 11/09/2018 0920   ALBUMIN 4.2 11/09/2018  0920   AST 14 11/09/2018 0920   ALT 9 11/09/2018 0920   ALKPHOS 119 (H) 11/09/2018 0920   BILITOT 0.3 11/09/2018 0920   GFRNONAA 107 11/09/2018 0920   GFRAA 123 11/09/2018 0920       Component Value Date/Time   WBC 7.7 11/09/2018 0920   RBC 4.95 11/09/2018 0920   HGB 10.2 (L) 11/09/2018 0920   HCT 33.0 (L) 11/09/2018 0920   PLT 565 (H) 11/09/2018 0920   MCV 67 (L) 11/09/2018 0920   MCH 20.6 (L) 11/09/2018 0920   MCHC 30.9 (L) 11/09/2018 0920   RDW 16.8 (H) 11/09/2018 0920   LYMPHSABS 2.1 11/09/2018 0920   EOSABS 0.2 11/09/2018 0920   BASOSABS 0.0 11/09/2018 0920    No results found for: POCLITH, LITHIUM   No results found for: PHENYTOIN, PHENOBARB, VALPROATE, CBMZ   .res Assessment: Plan:   Patient seen for 30 minutes greater than 50% of visit spent counseling patient regarding signs and symptoms of bipolar disorder to include various mood states to include depressive episodes, hypomanic episodes, and manic episodes.  Discussed that reported history seems to be consistent with possible bipolar II.  She describes predominantly depressive episodes with occasional episodes of hypomania.  Discussed treatment options for bipolar disorder. Discussed potential metabolic side effects associated with atypical antipsychotics, as well as potential risk for movement side effects. Advised pt to contact office if movement side effects occur.  Discussed potential benefits, risks, and side effects of Latuda.  Patient agrees to trial of Latuda. Will start Latuda 20 mg daily with evening meal for 1 week, then increase to 40 mg daily with evening meal for mood signs and  symptoms. Patient to follow-up in 4 weeks or sooner if clinically indicated. Patient advised to contact office with any questions, adverse effects, or acute worsening in signs and symptoms. Kathryn Velazquez was seen today for other and anxiety.  Diagnoses and all orders for this visit:  Bipolar II disorder ([place]) -     lurasidone (LATUDA) 20 MG TABS tablet; Take 1 tablet (20 mg total) by mouth daily with supper for 7 days. -     lurasidone (LATUDA) 40 MG TABS tablet; Take 1 tablet (40 mg total) by mouth daily with breakfast.  Anxiety disorder, unspecified type -     lurasidone (LATUDA) 20 MG TABS tablet; Take 1 tablet (20 mg total) by mouth daily with supper for 7 days. -     lurasidone (LATUDA) 40 MG TABS tablet; Take 1 tablet (40 mg total) by mouth daily with breakfast.     Please see After Visit Summary for patient specific instructions.  Future Appointments  Date Time Provider [place]  08/12/2019  3:00 PM Girtha Rm, NP-C [facility]  08/15/2019  2:45 PM Thayer Headings, PMHNP CP-CP None    No orders of the defined types were placed in this encounter.   -------------------------------

## 2019-08-12 ENCOUNTER — Ambulatory Visit: Payer: BC Managed Care – PPO | Admitting: Family Medicine

## 2019-08-12 ENCOUNTER — Telehealth: Payer: Self-pay

## 2019-08-12 ENCOUNTER — Other Ambulatory Visit: Payer: Self-pay | Admitting: Family Medicine

## 2019-08-12 DIAGNOSIS — D509 Iron deficiency anemia, unspecified: Secondary | ICD-10-CM

## 2019-08-12 DIAGNOSIS — D473 Essential (hemorrhagic) thrombocythemia: Secondary | ICD-10-CM

## 2019-08-12 DIAGNOSIS — D75839 Thrombocytosis, unspecified: Secondary | ICD-10-CM

## 2019-08-12 NOTE — Telephone Encounter (Signed)
Sent my chart message for pt to call and schedule labs before due to need for repeat iron. [place]

## 2019-08-12 NOTE — Telephone Encounter (Signed)
Pleas put in labs for pt CPE in Jan. Pt will come in a week before . She will also need a script of iron due to her not taking it . Thanks [employer]

## 2019-08-12 NOTE — Telephone Encounter (Signed)
Orders are in for future. She needs to have blood work rechecked before I can send in prescription for her. Last labs were in December 2019 and no follow up since. Thanks.

## 2019-08-15 ENCOUNTER — Ambulatory Visit: Payer: BC Managed Care – PPO | Admitting: Psychiatry

## 2019-08-29 ENCOUNTER — Other Ambulatory Visit: Payer: Self-pay

## 2019-08-29 ENCOUNTER — Telehealth: Payer: Self-pay | Admitting: Psychiatry

## 2019-08-29 DIAGNOSIS — F3181 Bipolar II disorder: Secondary | ICD-10-CM

## 2019-08-29 DIAGNOSIS — F419 Anxiety disorder, unspecified: Secondary | ICD-10-CM

## 2019-08-29 MED ORDER — LURASIDONE HCL 40 MG PO TABS: 40.0000 mg | ORAL_TABLET | Freq: Every day | ORAL | 0 refills | Status: DC

## 2019-08-29 NOTE — Telephone Encounter (Signed)
Refill submitted for latuda 40 mg 1/day

## 2019-08-29 NOTE — Telephone Encounter (Signed)
Patient need refill on the Latuda 40 mg., to be sent to [facility] on [address] pt scheduled appt, for 09/29/2019 @5 :00 pm

## 2019-09-05 ENCOUNTER — Other Ambulatory Visit: Payer: Self-pay

## 2019-09-05 DIAGNOSIS — F419 Anxiety disorder, unspecified: Secondary | ICD-10-CM

## 2019-09-05 DIAGNOSIS — F3181 Bipolar II disorder: Secondary | ICD-10-CM

## 2019-09-05 MED ORDER — LURASIDONE HCL 40 MG PO TABS: 40.0000 mg | ORAL_TABLET | Freq: Every day | ORAL | 0 refills | Status: DC

## 2019-09-05 NOTE — Telephone Encounter (Signed)
Pt called to check status on refill for Latuda. Not sure if they received request.

## 2019-09-05 NOTE — Telephone Encounter (Signed)
Refill submitted again to [facility] for Latuda 40 mg

## 2019-09-06 ENCOUNTER — Telehealth: Payer: Self-pay

## 2019-09-06 NOTE — Telephone Encounter (Signed)
Prior authorization submitted and approved for Latuda 40 mg effective 09/06/2019-09/05/2022 through [place]

## 2019-09-07 ENCOUNTER — Telehealth: Payer: Self-pay | Admitting: Internal Medicine

## 2019-09-07 NOTE — Telephone Encounter (Signed)
Left message that pt need to schedule for a follow-up on platelets and iron. The form she dropped off is ready for pick up but looks like she needs a tb skin test and we only do the blood work now of that. She I have advised her she could just follow-up all at once for all this.

## 2019-09-15 ENCOUNTER — Ambulatory Visit: Payer: BC Managed Care – PPO | Admitting: Family Medicine

## 2019-09-29 ENCOUNTER — Encounter: Payer: Self-pay | Admitting: Psychiatry

## 2019-09-29 ENCOUNTER — Other Ambulatory Visit: Payer: Self-pay

## 2019-09-29 ENCOUNTER — Ambulatory Visit (INDEPENDENT_AMBULATORY_CARE_PROVIDER_SITE_OTHER): Payer: BC Managed Care – PPO | Admitting: Psychiatry

## 2019-09-29 DIAGNOSIS — F419 Anxiety disorder, unspecified: Secondary | ICD-10-CM | POA: Diagnosis not present

## 2019-09-29 DIAGNOSIS — F3181 Bipolar II disorder: Secondary | ICD-10-CM

## 2019-09-29 MED ORDER — LURASIDONE HCL 40 MG PO TABS: 40.0000 mg | ORAL_TABLET | Freq: Every day | ORAL | 3 refills | Status: DC

## 2019-09-29 NOTE — Progress Notes (Signed)
   09/29/19 1731  Facial and Oral Movements  Muscles of Facial Expression 0  Lips and Perioral Area 0  Jaw 0  Tongue 0  Extremity Movements  Upper (arms, wrists, hands, fingers) 0  Lower (legs, knees, ankles, toes) 0  Trunk Movements  Neck, shoulders, hips 0  Overall Severity  Severity of abnormal movements (highest score from questions above) 0  Incapacitation due to abnormal movements 0  Patient's awareness of abnormal movements (rate only patient's report) 0  AIMS Total Score  AIMS Total Score 0

## 2019-09-29 NOTE — Progress Notes (Signed)
Kathryn Velazquez [id number] [birth year] 33 y.o.  Subjective:   Patient ID:  Kathryn Velazquez is a 33 y.o. (DOB 01-06-1986) female.  Chief Complaint:  Chief Complaint  Patient presents with  . Follow-up    Mood disturbance, anxiety    HPI Kathryn Velazquez presents to the office today for follow-up of mood disturbance and anxiety.  She reports that she notices less depression with Latuda. Reports that she has sometimes not taken it consistently at times and noticed her mood was not as good. She denies depressed mood when taking Latuda consistently and less irritability. She has not noticed any changes with impulsivity or spending. Noticed one period of elevated mood and increased talkativeness. She reports energy and motivation have been stable. She reports that she initially noticed some improved sleep and now is having difficulty staying asleep. Falling asleep without difficulty and then awakening multiple times during the night. Appetite has been stable. Concentration has been adequate. Has not been as forgetful and not having to write things down as often. Has not noticed a significant improvement with anxiety since starting Latuda- "I still have anxious days." Denies any recent panic attacks. Reports anxiety is typically what awakens her at night. Denies SI.   Today was first day back all day at school. Denies any acute stressors and reports family is doing well.   Reports that her relationship with her sister has been improving since her mood has been more stable.   Past Medication Trials: Lexapro- Reports that she did not see a significant difference (negative or positive). Reports taking it for most of the year. Prozac Pristiq- took only briefly due to insurance/cost. "I think I liked it." Depakote- Prescribed but never took.  Lamictal- Prescribed but never took. Xanax- Took in 2013 or 2014. "It's not a practical everyday medicine." May have taken 1 mg. Propranolol  Review of  Systems:  Review of Systems  Gastrointestinal: Negative for nausea.  Musculoskeletal: Negative for gait problem.  Neurological: Negative for tremors.  Psychiatric/Behavioral:       Please refer to HPI    Medications: I have reviewed the patient's current medications.  Current Outpatient Medications  Medication Sig Dispense Refill  . lurasidone (LATUDA) 40 MG TABS tablet Take 1 tablet (40 mg total) by mouth daily with breakfast. 30 tablet 3   No current facility-administered medications for this visit.     Medication Side Effects: None  Allergies: No Known Allergies  Past Medical History:  Diagnosis Date  . Anemia 11/10/2018  . Anxiety and depression   . Thrombocytosis ([place]) 11/10/2018    Family History  Problem Relation Age of Onset  . Aneurysm Mother   . Alcohol abuse Mother   . Hypertension Father   . Diabetes Brother     Social History   Socioeconomic History  . Marital status: Single    Spouse name: Not on file  . Number of children: Not on file  . Years of education: Not on file  . Highest education level: Not on file  Occupational History  . Not on file  Social Needs  . Financial resource strain: Not on file  . Food insecurity    Worry: Not on file    Inability: Not on file  . Transportation needs    Medical: Not on file    Non-medical: Not on file  Tobacco Use  . Smoking status: Current Some Day Smoker    Types: Cigarettes  . Smokeless tobacco: Never Used  . Tobacco comment: twice a  month  Substance and Sexual Activity  . Alcohol use: Yes    Comment: rare  . Drug use: No  . Sexual activity: Not Currently    Birth control/protection: None  Lifestyle  . Physical activity    Days per week: Not on file    Minutes per session: Not on file  . Stress: Not on file  Relationships  . Social Herbalist on phone: Not on file    Gets together: Not on file    Attends religious service: Not on file    Active member of club or organization:  Not on file    Attends meetings of clubs or organizations: Not on file    Relationship status: Not on file  . Intimate partner violence    Fear of current or ex partner: Not on file    Emotionally abused: Not on file    Physically abused: Not on file    Forced sexual activity: Not on file  Other Topics Concern  . Not on file  Social History Narrative  . Not on file    Past Medical History, Surgical history, Social history, and Family history were reviewed and updated as appropriate.   Please see review of systems for further details on the patient's review from today.   Objective:   Physical Exam:  There were no vitals taken for this visit.  Physical Exam Constitutional:      General: She is not in acute distress.    Appearance: She is well-developed.  Musculoskeletal:        General: No deformity.  Neurological:     Mental Status: She is alert and oriented to person, place, and time.     Coordination: Coordination normal.  Psychiatric:        Attention and Perception: Attention and perception normal. She does not perceive auditory or visual hallucinations.        Mood and Affect: Mood is not depressed. Affect is not labile, blunt, angry or inappropriate.        Speech: Speech normal.        Behavior: Behavior normal.        Thought Content: Thought content normal. Thought content is not paranoid or delusional. Thought content does not include homicidal or suicidal ideation. Thought content does not include homicidal or suicidal plan.        Cognition and Memory: Cognition and memory normal.        Judgment: Judgment normal.     Comments: Mood presents as mildly anxious Insight intact. No delusions.      Lab Review:     Component Value Date/Time   NA 140 11/09/2018 0920   K 4.2 11/09/2018 0920   CL 102 11/09/2018 0920   CO2 21 11/09/2018 0920   GLUCOSE 92 11/09/2018 0920   BUN 12 11/09/2018 0920   CREATININE 0.75 11/09/2018 0920   CALCIUM 9.5 11/09/2018 0920    PROT 7.4 11/09/2018 0920   ALBUMIN 4.2 11/09/2018 0920   AST 14 11/09/2018 0920   ALT 9 11/09/2018 0920   ALKPHOS 119 (H) 11/09/2018 0920   BILITOT 0.3 11/09/2018 0920   GFRNONAA 107 11/09/2018 0920   GFRAA 123 11/09/2018 0920       Component Value Date/Time   WBC 7.7 11/09/2018 0920   RBC 4.95 11/09/2018 0920   HGB 10.2 (L) 11/09/2018 0920   HCT 33.0 (L) 11/09/2018 0920   PLT 565 (H) 11/09/2018 0920   MCV 67 (L) 11/09/2018  0920   MCH 20.6 (L) 11/09/2018 0920   MCHC 30.9 (L) 11/09/2018 0920   RDW 16.8 (H) 11/09/2018 0920   LYMPHSABS 2.1 11/09/2018 0920   EOSABS 0.2 11/09/2018 0920   BASOSABS 0.0 11/09/2018 0920    No results found for: POCLITH, LITHIUM   No results found for: PHENYTOIN, PHENOBARB, VALPROATE, CBMZ   .res Assessment: Plan:   Patient seen for 30 minutes and greater than 50% of visit spent counseling patient and coordinating care to include discussing possible treatment options such as increasing Latuda to 60 mg daily for increased stabilization of mood and to improve anxiety and insomnia.  Patient reports that she would prefer to remain on 40 mg daily since this has been working well for her over all without any tolerability issues.  Encourage patient to contact office if she experiences worsening mood or anxiety signs and symptoms and/or would like to start trial of 60 mg daily.  Patient verbalizes understanding. Patient to follow-up in 3 to 4 months or sooner if clinically indicated. Patient advised to contact office with any questions, adverse effects, or acute worsening in signs and symptoms.  Kathryn Velazquez was seen today for follow-up.  Diagnoses and all orders for this visit:  Bipolar II disorder ([place]) -     lurasidone (LATUDA) 40 MG TABS tablet; Take 1 tablet (40 mg total) by mouth daily with breakfast.  Anxiety disorder, unspecified type -     lurasidone (LATUDA) 40 MG TABS tablet; Take 1 tablet (40 mg total) by mouth daily with breakfast.     Please  see After Visit Summary for patient specific instructions.  Future Appointments  Date Time Provider [place]  11/16/2019  8:15 AM [facility] NURSE [facility]  11/21/2019 11:15 AM Girtha Rm, NP-C [facility]  01/24/2020  5:00 PM Thayer Headings, PMHNP CP-CP None    No orders of the defined types were placed in this encounter.   -------------------------------

## 2019-10-04 ENCOUNTER — Encounter: Payer: Self-pay | Admitting: Emergency Medicine

## 2019-10-04 ENCOUNTER — Other Ambulatory Visit: Payer: Self-pay

## 2019-10-04 ENCOUNTER — Ambulatory Visit
Admission: EM | Admit: 2019-10-04 | Discharge: 2019-10-04 | Disposition: A | Discharge status: Home / Self Care | Payer: BC Managed Care – PPO | Attending: Physician Assistant | Admitting: Physician Assistant

## 2019-10-04 DIAGNOSIS — Z202 Contact with and (suspected) exposure to infections with a predominantly sexual mode of transmission: Secondary | ICD-10-CM | POA: Diagnosis present

## 2019-10-04 MED ORDER — AZITHROMYCIN 500 MG PO TABS: 1000.0000 mg | ORAL_TABLET | Freq: Once | ORAL | Status: DC

## 2019-10-04 MED ORDER — AZITHROMYCIN 500 MG PO TABS: 1000.0000 mg | ORAL_TABLET | Freq: Once | ORAL | 0 refills | Status: AC

## 2019-10-04 MED ORDER — FLUCONAZOLE 150 MG PO TABS: 150.0000 mg | ORAL_TABLET | Freq: Every day | ORAL | 0 refills | Status: DC

## 2019-10-04 NOTE — ED Triage Notes (Signed)
Pt presents to [facility] for assessment of STD Check.  Denies symptoms.  LMP 09/28/19.  Does not use birth control or condoms.  Denies concerns for HIv or Syphilis.

## 2019-10-04 NOTE — ED Provider Notes (Signed)
[facility]    CSN: [id number] Arrival date & time: 10/04/19  [id number]      History   Chief Complaint Chief Complaint  Patient presents with  . STD Check    HPI Kathryn Velazquez is a 33 y.o. female.   33 year old female comes in for std testing after exposure. States last partner was tested positive for chlamydia. She is asymptomatic.  Denies abdominal pain, nausea, vomiting, diarrhea.  Denies vaginal discharge, itching, spotting.  Denies fever, chills, body aches.  Denies urinary symptoms such as frequency, dysuria, hematuria.      Past Medical History:  Diagnosis Date  . Anemia 11/10/2018  . Anxiety and depression   . Thrombocytosis ([place]) 11/10/2018    Patient Active Problem List   Diagnosis Date Noted  . Anemia 11/10/2018  . Thrombocytosis ([place]) 11/10/2018  . Morbid obesity ([place]) 11/09/2018  . Anxiety and depression     Past Surgical History:  Procedure Laterality Date  . CESAREAN SECTION      OB History   No obstetric history on file.      Home Medications    Prior to Admission medications   Medication Sig Start Date End Date Taking? Authorizing Provider  azithromycin (ZITHROMAX) 500 MG tablet Take 2 tablets (1,000 mg total) by mouth once for 1 dose. Take first 2 tablets together, then 1 every day until finished. 10/04/19 10/04/19  Tasia Catchings, Percy Winterrowd V, PA-C  fluconazole (DIFLUCAN) 150 MG tablet Take 1 tablet (150 mg total) by mouth daily. Take second dose 72 hours later if symptoms still persists. 10/04/19   Tasia Catchings, Miangel Flom V, PA-C  lurasidone (LATUDA) 40 MG TABS tablet Take 1 tablet (40 mg total) by mouth daily with breakfast. 09/29/19   Thayer Headings, PMHNP    Family History Family History  Problem Relation Age of Onset  . Aneurysm Mother   . Alcohol abuse Mother   . Hypertension Father   . Diabetes Brother     Social History Social History   Tobacco Use  . Smoking status: Current Some Day Smoker    Types: Cigarettes  . Smokeless tobacco:  Never Used  . Tobacco comment: twice a month  Substance Use Topics  . Alcohol use: Yes    Comment: rare  . Drug use: No     Allergies   Patient has no known allergies.   Review of Systems Review of Systems  Reason unable to perform ROS: See HPI as above.     Physical Exam Triage Vital Signs ED Triage Vitals  Enc Vitals Group     BP 10/04/19 0953 139/81     Pulse Rate 10/04/19 0953 72     Resp 10/04/19 0953 18     Temp 10/04/19 0953 97.9 F (36.6 C)     Temp Source 10/04/19 0953 Temporal     SpO2 10/04/19 0953 98 %     Weight --      Height --      Head Circumference --      Peak Flow --      Pain Score 10/04/19 0955 0     Pain Loc --      Pain Edu? --      Excl. in [place]? --    No data found.  Updated Vital Signs BP 139/81 (BP Location: Left Arm)   Pulse 72   Temp 97.9 F (36.6 C) (Temporal)   Resp 18   LMP 09/28/2019   SpO2 98%   Physical Exam  Constitutional:      General: She is not in acute distress.    Appearance: Normal appearance. She is well-developed. She is not diaphoretic.  HENT:     Head: Normocephalic and atraumatic.  Eyes:     Conjunctiva/sclera: Conjunctivae normal.     Pupils: Pupils are equal, round, and reactive to light.  Neck:     Musculoskeletal: Normal range of motion and neck supple.  Pulmonary:     Effort: Pulmonary effort is normal.  Skin:    General: Skin is warm and dry.  Neurological:     Mental Status: She is alert and oriented to person, place, and time.      UC Treatments / Results  Labs (all labs ordered are listed, but only abnormal results are displayed) Labs Reviewed  CERVICOVAGINAL ANCILLARY ONLY    EKG   Radiology No results found.  Procedures Procedures (including critical care time)  Medications Ordered in UC Medications - No data to display  Initial Impression / Assessment and Plan / UC Course  I have reviewed the triage vital signs and the nursing notes.  Pertinent labs & imaging results  that were available during my care of the patient were reviewed by me and considered in my medical decision making (see chart for details).    Patient was treated empirically for chlamydia. Patient has not eaten today, discussed taking azithromycin in office vs taking at home after food intake. Patient would like to take at home after food intake. Azithromycin 1g called into pharmacy. Cytology sent, patient will be contacted with any positive results that require additional treatment. Patient to refrain from sexual activity for the next 7 days. Return precautions given.   Final Clinical Impressions(s) / UC Diagnoses   Final diagnoses:  Exposure to STD   ED Prescriptions    Medication Sig Dispense Auth. Provider   fluconazole (DIFLUCAN) 150 MG tablet Take 1 tablet (150 mg total) by mouth daily. Take second dose 72 hours later if symptoms still persists. 2 tablet Ulyssa Walthour V, PA-C   azithromycin (ZITHROMAX) 500 MG tablet Take 2 tablets (1,000 mg total) by mouth once for 1 dose. Take first 2 tablets together, then 1 every day until finished. 2 tablet Ok Edwards, PA-C     PDMP not reviewed this encounter.   Ok Edwards, PA-C 10/04/19 1031

## 2019-10-04 NOTE — ED Notes (Signed)
Patient able to ambulate independently  

## 2019-10-04 NOTE — Discharge Instructions (Signed)
Azithromycin 1g by mouth one dose called into pharmacy. Diflucan as needed.Cytology sent, you will be contacted with any positive results that requires further treatment. Refrain from sexual activity and alcohol use for the next 7 days. Monitor for any worsening of symptoms, fever, abdominal pain, nausea, vomiting, to follow up for reevaluation.

## 2019-10-06 LAB — CERVICOVAGINAL ANCILLARY ONLY
Chlamydia: NEGATIVE
Neisseria Gonorrhea: NEGATIVE
Trichomonas: POSITIVE — AB

## 2019-10-07 ENCOUNTER — Telehealth: Payer: Self-pay | Admitting: Emergency Medicine

## 2019-10-07 MED ORDER — METRONIDAZOLE 500 MG PO TABS: 2000.0000 mg | ORAL_TABLET | Freq: Once | ORAL | 0 refills | Status: AC

## 2019-10-07 NOTE — Telephone Encounter (Signed)
Trichomonas is positive. Rx  for Flagyl 2 grams, once was sent to the pharmacy of record. Pt needs education to refrain from sexual intercourse for 7 days to give the medicine time to work. Sexual partners need to be notified and tested/treated. Condoms may reduce risk of reinfection. Recheck for further evaluation if symptoms are not improving.   Patient contacted and made aware of    results. Pt verbalized understanding and had all questions answered.

## 2019-11-16 ENCOUNTER — Other Ambulatory Visit: Payer: Self-pay

## 2019-11-20 DIAGNOSIS — E782 Mixed hyperlipidemia: Secondary | ICD-10-CM | POA: Insufficient documentation

## 2019-11-20 NOTE — Progress Notes (Deleted)
Subjective:    Patient ID: Kathryn Velazquez, female    DOB: May 04, 1986, 34 y.o.   MRN: [id number]  HPI No chief complaint on file.  She is here for a complete physical exam and to follow up on chronic health conditions.  IDA-   Thrombocytosis-   Morbid obesity-   Anxiety and depression-   Other providers:    Social history: Lives with ***, works as ***, *** Smoking, drinking alcohol, drug use  Diet: *** Excerise: ***  Immunizations:  Health maintenance:  Mammogram: Colonoscopy: Last Gynecological Exam: Last Menstrual cycle: Pregnancies:  Last Dental Exam: Last Eye Exam:  Wears seatbelt always, uses sunscreen, smoke detectors in home and functioning, does not text while driving and feels safe in home environment.   Reviewed allergies, medications, past medical, surgical, family, and social history.   Review of Systems Review of Systems Constitutional: -fever, -chills, -sweats, -unexpected weight change,-fatigue ENT: -runny nose, -ear pain, -sore throat Cardiology:  -chest pain, -palpitations, -edema Respiratory: -cough, -shortness of breath, -wheezing Gastroenterology: -abdominal pain, -nausea, -vomiting, -diarrhea, -constipation  Hematology: -bleeding or bruising problems Musculoskeletal: -arthralgias, -myalgias, -joint swelling, -back pain Ophthalmology: -vision changes Urology: -dysuria, -difficulty urinating, -hematuria, -urinary frequency, -urgency Neurology: -headache, -weakness, -tingling, -numbness       Objective:   Physical Exam There were no vitals taken for this visit.  General Appearance:    Alert, cooperative, no distress, appears stated age  Head:    Normocephalic, without obvious abnormality, atraumatic  Eyes:    PERRL, conjunctiva/corneas clear, EOM's intact, fundi    benign  Ears:    Normal TM's and external ear canals  Nose:   Nares normal, mucosa normal, no drainage or sinus   tenderness  Throat:   Lips, mucosa, and tongue  normal; teeth and gums normal  Neck:   Supple, no lymphadenopathy;  thyroid:  no   enlargement/tenderness/nodules; no carotid   bruit or JVD  Back:    Spine nontender, no curvature, ROM normal, no CVA     tenderness  Lungs:     Clear to auscultation bilaterally without wheezes, rales or     ronchi; respirations unlabored  Chest Wall:    No tenderness or deformity   Heart:    Regular rate and rhythm, S1 and S2 normal, no murmur, rub   or gallop  Breast Exam:    No tenderness, masses, or nipple discharge or inversion.      No axillary lymphadenopathy  Abdomen:     Soft, non-tender, nondistended, normoactive bowel sounds,    no masses, no hepatosplenomegaly  Genitalia:    Normal external genitalia without lesions.  BUS and vagina normal; cervix without lesions, or cervical motion tenderness. No abnormal vaginal discharge.  Uterus and adnexa not enlarged, nontender, no masses.  Pap performed  Rectal:    Not performed due to age<40 and no related complaints  Extremities:   No clubbing, cyanosis or edema  Pulses:   2+ and symmetric all extremities  Skin:   Skin color, texture, turgor normal, no rashes or lesions  Lymph nodes:   Cervical, supraclavicular, and axillary nodes normal  Neurologic:   CNII-XII intact, normal strength, sensation and gait; reflexes 2+ and symmetric throughout          Psych:   Normal mood, affect, hygiene and grooming.         Assessment & Plan:  Routine general medical examination at a health care facility  Anxiety and depression  Thrombocytosis ([place])  Morbid obesity (  [place])  Iron deficiency anemia, unspecified iron deficiency anemia type  Mixed hyperlipidemia

## 2019-11-21 ENCOUNTER — Encounter: Payer: Self-pay | Admitting: Family Medicine

## 2019-12-12 ENCOUNTER — Other Ambulatory Visit: Payer: Self-pay

## 2019-12-12 ENCOUNTER — Encounter: Payer: Self-pay | Admitting: Family Medicine

## 2019-12-12 ENCOUNTER — Other Ambulatory Visit: Payer: Self-pay | Admitting: Family Medicine

## 2019-12-12 ENCOUNTER — Ambulatory Visit: Payer: BC Managed Care – PPO | Admitting: Family Medicine

## 2019-12-12 VITALS — BP 110/70 | HR 87 | Temp 97.3°F | Ht 61.0 in | Wt 253.8 lb

## 2019-12-12 DIAGNOSIS — Z124 Encounter for screening for malignant neoplasm of cervix: Secondary | ICD-10-CM | POA: Diagnosis not present

## 2019-12-12 DIAGNOSIS — D509 Iron deficiency anemia, unspecified: Secondary | ICD-10-CM | POA: Insufficient documentation

## 2019-12-12 DIAGNOSIS — D241 Benign neoplasm of right breast: Secondary | ICD-10-CM

## 2019-12-12 DIAGNOSIS — K0889 Other specified disorders of teeth and supporting structures: Secondary | ICD-10-CM

## 2019-12-12 DIAGNOSIS — Z0000 Encounter for general adult medical examination without abnormal findings: Secondary | ICD-10-CM | POA: Diagnosis not present

## 2019-12-12 DIAGNOSIS — Z113 Encounter for screening for infections with a predominantly sexual mode of transmission: Secondary | ICD-10-CM

## 2019-12-12 NOTE — Patient Instructions (Signed)
Obgyn Offices:   [facility] [address] [place], [place] [phone]  [facility] Address: [address] [place], [place] [address] Phone: [phone]  [facility] [address] [place], [place] [address] Phone: [phone]  [place], [place] [address] Phone: [phone]      Preventive Care 56-33 Years Old, Female Preventive care refers to visits with your health care provider and lifestyle choices that can promote health and wellness. This includes:  A yearly physical exam. This may also be called an annual well check.  Regular dental visits and eye exams.  Immunizations.  Screening for certain conditions.  Healthy lifestyle choices, such as eating a healthy diet, getting regular exercise, not using drugs or products that contain nicotine and tobacco, and limiting alcohol use. What can I expect for my preventive care visit? Physical exam Your health care provider will check your:  Height and weight. This may be used to calculate body mass index (BMI), which tells if you are at a healthy weight.  Heart rate and blood pressure.  Skin for abnormal spots. Counseling Your health care provider may ask you questions about your:  Alcohol, tobacco, and drug use.  Emotional well-being.  Home and relationship well-being.  Sexual activity.  Eating habits.  Work and work Statistician.  Method of birth control.  Menstrual cycle.  Pregnancy history. What immunizations do I need?  Influenza (flu) vaccine  This is recommended every year. Tetanus, diphtheria, and pertussis (Tdap) vaccine  You may need a Td booster every 10 years. Varicella (chickenpox) vaccine  You may need this if you have not been vaccinated. Human papillomavirus (HPV) vaccine  If recommended by your health care provider, you may need three doses over  6 months. Measles, mumps, and rubella (MMR) vaccine  You may need at least one dose of MMR. You may also need a second dose. Meningococcal conjugate (MenACWY) vaccine  One dose is recommended if you are age 60-21 years and a first-year college student living in a residence hall, or if you have one of several medical conditions. You may also need additional booster doses. Pneumococcal conjugate (PCV13) vaccine  You may need this if you have certain conditions and were not previously vaccinated. Pneumococcal polysaccharide (PPSV23) vaccine  You may need one or two doses if you smoke cigarettes or if you have certain conditions. Hepatitis A vaccine  You may need this if you have certain conditions or if you travel or work in places where you may be exposed to hepatitis A. Hepatitis B vaccine  You may need this if you have certain conditions or if you travel or work in places where you may be exposed to hepatitis B. Haemophilus influenzae type b (Hib) vaccine  You may need this if you have certain conditions. You may receive vaccines as individual doses or as more than one vaccine together in one shot (combination vaccines). Talk with your health care provider about the risks and benefits of combination vaccines. What tests do I need?  Blood tests  Lipid and cholesterol levels. These may be checked every 5 years starting at age 27.  Hepatitis C test.  Hepatitis B test. Screening  Diabetes screening. This is done by checking your blood sugar (glucose) after you have not eaten for a while (fasting).  Sexually transmitted disease (STD) testing.  BRCA-related cancer screening. This may be done if you have a family history  of breast, ovarian, tubal, or peritoneal cancers.  Pelvic exam and Pap test. This may be done every 3 years starting at age 71. Starting at age 19, this may be done every 5 years if you have a Pap test in combination with an HPV test. Talk with your health care  provider about your test results, treatment options, and if necessary, the need for more tests. Follow these instructions at home: Eating and drinking   Eat a diet that includes fresh fruits and vegetables, whole grains, lean protein, and low-fat dairy.  Take vitamin and mineral supplements as recommended by your health care provider.  Do not drink alcohol if: ? Your health care provider tells you not to drink. ? You are pregnant, may be pregnant, or are planning to become pregnant.  If you drink alcohol: ? Limit how much you have to 0-1 drink a day. ? Be aware of how much alcohol is in your drink. In the [place] one drink equals one 12 oz bottle of beer (355 mL), one 5 oz glass of wine (148 mL), or one 1 oz glass of hard liquor (44 mL). Lifestyle  Take daily care of your teeth and gums.  Stay active. Exercise for at least 30 minutes on 5 or more days each week.  Do not use any products that contain nicotine or tobacco, such as cigarettes, e-cigarettes, and chewing tobacco. If you need help quitting, ask your health care provider.  If you are sexually active, practice safe sex. Use a condom or other form of birth control (contraception) in order to prevent pregnancy and STIs (sexually transmitted infections). If you plan to become pregnant, see your health care provider for a preconception visit. What's next?  Visit your health care provider once a year for a well check visit.  Ask your health care provider how often you should have your eyes and teeth checked.  Stay up to date on all vaccines. This information is not intended to replace advice given to you by your health care provider. Make sure you discuss any questions you have with your health care provider. Document Revised: 07/15/2018 Document Reviewed: 07/15/2018 Elsevier Patient Education  2020 [employer].

## 2019-12-12 NOTE — Progress Notes (Addendum)
Subjective:    Patient ID: Kathryn Velazquez, female    DOB: February 27, 1986, 34 y.o.   MRN: [id number]  HPI Chief Complaint  Patient presents with  . cpe    fasting cpe, needs pap smear. no other concerns   She is here for a complete physical exam.  States she has a tooth problem. Complains of right upper dental pain, a whole in her tooth. She has taken ibuprofen for this. States her dental office did not get back to her today.   Other providers: [facility]   Currently in a relationship. Would like STD testing  Social history: Lives with her 38 year old son,  works at [employer]  Smoking twice per month, drinking alcohol rarely, no drug use  Diet: fairly unhealthy Excerise: nothing regular   Immunizations: flu shot- declines   Health maintenance:  Mammogram: breast US in 2018 and overdue for follow up Colonoscopy: N/A Last Gynecological Exam: years  Last Menstrual cycle: 11/06/2019 and regular  No birth control  Pregnancies: 1 Last Dental Exam: 07/2019 Last Eye Exam: 2020   Wears seatbelt always, smoke detectors in home and functioning, does not text while driving and feels safe in home environment.   Reviewed allergies, medications, past medical, surgical, family, and social history.   Review of Systems Review of Systems Constitutional: -fever, -chills, -sweats, -unexpected weight change,-fatigue ENT: -runny nose, -ear pain, -sore throat Cardiology:  -chest pain, -palpitations, -edema Respiratory: -cough, -shortness of breath, -wheezing Gastroenterology: -abdominal pain, -nausea, -vomiting, -diarrhea, -constipation  Hematology: -bleeding or bruising problems Musculoskeletal: -arthralgias, -myalgias, -joint swelling, -back pain Ophthalmology: -vision changes Urology: -dysuria, -difficulty urinating, -hematuria, -urinary frequency, -urgency Neurology: -headache, -weakness, -tingling, -numbness         Objective:   Physical Exam BP 110/70   Pulse 87   Temp (!) 97.3 F (36.3 C)   Ht 5\' 1"  (1.549 m)   Wt 253 lb 12.8 oz (115.1 kg)   BMI 47.96 kg/m   General Appearance:    Alert, cooperative, no distress, appears stated age  Head:    Normocephalic, without obvious abnormality, atraumatic  Eyes:    PERRL, conjunctiva/corneas clear, EOM's intact  Ears:    Normal TM's and external ear canals  Nose:   Mask in place   Throat:   Mask in place   Neck:   Supple, no lymphadenopathy;  thyroid:  no   enlargement/tenderness/nodules; no JVD  Back:    ROM normal, no CVA tenderness  Lungs:     Clear to auscultation bilaterally without wheezes, rales or     ronchi; respirations unlabored  Chest Wall:    No tenderness or deformity   Heart:    Regular rate and rhythm, S1 and S2 normal, no murmur, rub   or gallop  Breast Exam:    No tenderness, masses, or nipple discharge or inversion.      No axillary lymphadenopathy  Abdomen:     Soft, non-tender, nondistended, normoactive bowel sounds,    no masses, no hepatosplenomegaly  Genitalia:    Normal external genitalia without lesions.  BUS and vagina normal; unable to visualize cervix due to body habitus and speculum in office.  No abnormal vaginal discharge.  Unable to perform pap with speculums in our office. Chaperone present. Refer to OB/GYN  Rectal:    Not performed due to age<40 and no related complaints  Extremities:   No clubbing, cyanosis or edema  Pulses:  2+ and symmetric all extremities  Skin:   Skin color, texture, turgor normal, no rashes or lesions  Lymph nodes:   Cervical, supraclavicular, and axillary nodes normal  Neurologic:   CNII-XII intact, normal strength, sensation and gait          Psych:   Normal mood, affect, hygiene and grooming.         Assessment & Plan:  Routine general medical examination at a health care facility - Plan: CBC with Differential/Platelet, Comprehensive metabolic panel, TSH, T4, free, Lipid panel -Here  today for fasting CPE.  Preventive healthcare reviewed and due for Pap smear.  Declines flu shot, Tdap up-to-date.  Counseling on healthy diet and exercise. Declines pregnancy test today. She will check a home test if her cycle does not start on time.   Iron deficiency anemia, unspecified iron deficiency anemia type - Plan: CBC with Differential/Platelet, Iron, TIBC and Ferritin Panel -Is not currently on iron.  Asymptomatic.  Check CBC, iron panel and follow-up.  Screening for cervical cancer -Attempted to do a Pap smear with chaperone, Gabriel Cirri, present.  Used 2 different speculums and unfortunately was unable to visualize her cervix.  I will give her a list of OB/GYN offices and she will call to schedule this.  Breast fibroadenoma in female, right - Plan: US BREAST LTD UNI RIGHT INC AXILLA, CANCELED: US BREAST COMPLETE UNI RIGHT INC AXILLA -Overdue for follow-up ultrasound.  I will order this today.  Pain in tooth -No obvious abscess or infection.  Recommend continuing with ibuprofen and follow-up with her dentist.  She may consider trying Orajel.  Screen for STD (sexually transmitted disease) - Plan: RPR, HIV Antibody (routine testing w rflx)  Morbid obesity ([place]) - Plan: TSH, T4, free, Lipid panel -Discussed potential health risks associated with obesity

## 2019-12-13 LAB — IRON,TIBC AND FERRITIN PANEL
Ferritin: 27 ng/mL (ref 15–150)
Iron Saturation: 6 % — CL (ref 15–55)
Iron: 26 ug/dL — ABNORMAL LOW (ref 27–159)
Total Iron Binding Capacity: 401 ug/dL (ref 250–450)
UIBC: 375 ug/dL (ref 131–425)

## 2019-12-13 LAB — COMPREHENSIVE METABOLIC PANEL
ALT: 13 IU/L (ref 0–32)
AST: 11 IU/L (ref 0–40)
Albumin/Globulin Ratio: 1.2 (ref 1.2–2.2)
Albumin: 4.3 g/dL (ref 3.8–4.8)
Alkaline Phosphatase: 125 IU/L — ABNORMAL HIGH (ref 39–117)
BUN/Creatinine Ratio: 17 (ref 9–23)
BUN: 12 mg/dL (ref 6–20)
Bilirubin Total: 0.3 mg/dL (ref 0.0–1.2)
CO2: 22 mmol/L (ref 20–29)
Calcium: 9.6 mg/dL (ref 8.7–10.2)
Chloride: 104 mmol/L (ref 96–106)
Creatinine, Ser: 0.69 mg/dL (ref 0.57–1.00)
GFR calc Af Amer: 133 mL/min/{1.73_m2} (ref >59)
GFR calc non Af Amer: 116 mL/min/{1.73_m2} (ref >59)
Globulin, Total: 3.7 g/dL (ref 1.5–4.5)
Glucose: 77 mg/dL (ref 65–99)
Potassium: 4.1 mmol/L (ref 3.5–5.2)
Sodium: 142 mmol/L (ref 134–144)
Total Protein: 8 g/dL (ref 6.0–8.5)

## 2019-12-13 LAB — CBC WITH DIFFERENTIAL/PLATELET
Basophils Absolute: 0 10*3/uL (ref 0.0–0.2)
Basos: 0 %
EOS (ABSOLUTE): 0.2 10*3/uL (ref 0.0–0.4)
Eos: 1 %
Hematocrit: 34.5 % (ref 34.0–46.6)
Hemoglobin: 11 g/dL — ABNORMAL LOW (ref 11.1–15.9)
Immature Grans (Abs): 0 10*3/uL (ref 0.0–0.1)
Immature Granulocytes: 0 %
Lymphocytes Absolute: 2.9 10*3/uL (ref 0.7–3.1)
Lymphs: 24 %
MCH: 22.3 pg — ABNORMAL LOW (ref 26.6–33.0)
MCHC: 31.9 g/dL (ref 31.5–35.7)
MCV: 70 fL — ABNORMAL LOW (ref 79–97)
Monocytes Absolute: 0.9 10*3/uL (ref 0.1–0.9)
Monocytes: 7 %
Neutrophils Absolute: 7.8 10*3/uL — ABNORMAL HIGH (ref 1.4–7.0)
Neutrophils: 68 %
Platelets: 536 10*3/uL — ABNORMAL HIGH (ref 150–450)
RBC: 4.93 x10E6/uL (ref 3.77–5.28)
RDW: 16.9 % — ABNORMAL HIGH (ref 11.7–15.4)
WBC: 11.8 10*3/uL — ABNORMAL HIGH (ref 3.4–10.8)

## 2019-12-13 LAB — RPR: RPR Ser Ql: NONREACTIVE

## 2019-12-13 LAB — LIPID PANEL
Chol/HDL Ratio: 3.7 ratio (ref 0.0–4.4)
Cholesterol, Total: 153 mg/dL (ref 100–199)
HDL: 41 mg/dL (ref >39)
LDL Chol Calc (NIH): 103 mg/dL — ABNORMAL HIGH (ref 0–99)
Triglycerides: 41 mg/dL (ref 0–149)
VLDL Cholesterol Cal: 9 mg/dL (ref 5–40)

## 2019-12-13 LAB — HIV ANTIBODY (ROUTINE TESTING W REFLEX): HIV Screen 4th Generation wRfx: NONREACTIVE

## 2019-12-13 LAB — T4, FREE: Free T4: 1.15 ng/dL (ref 0.82–1.77)

## 2019-12-13 LAB — TSH: TSH: 1.13 u[IU]/mL (ref 0.450–4.500)

## 2019-12-23 ENCOUNTER — Other Ambulatory Visit: Payer: BC Managed Care – PPO

## 2020-01-05 ENCOUNTER — Other Ambulatory Visit: Payer: BC Managed Care – PPO

## 2020-01-24 ENCOUNTER — Ambulatory Visit (INDEPENDENT_AMBULATORY_CARE_PROVIDER_SITE_OTHER): Payer: BC Managed Care – PPO | Admitting: Psychiatry

## 2020-01-24 ENCOUNTER — Encounter: Payer: Self-pay | Admitting: Psychiatry

## 2020-01-24 DIAGNOSIS — F419 Anxiety disorder, unspecified: Secondary | ICD-10-CM

## 2020-01-24 DIAGNOSIS — F3181 Bipolar II disorder: Secondary | ICD-10-CM

## 2020-01-24 MED ORDER — LURASIDONE HCL 40 MG PO TABS: 40.0000 mg | ORAL_TABLET | Freq: Every day | ORAL | 5 refills | Status: DC

## 2020-01-24 NOTE — Progress Notes (Signed)
Kathryn Velazquez [id number] 20-Oct-1986 34 y.o.  Virtual Visit via Telephone Note  I connected with pt on 01/24/20 at  5:00 PM EST by telephone and verified that I am speaking with the correct person using two identifiers.   I discussed the limitations, risks, security and privacy concerns of performing an evaluation and management service by telephone and the availability of in person appointments. I also discussed with the patient that there may be a patient responsible charge related to this service. The patient expressed understanding and agreed to proceed.   I discussed the assessment and treatment plan with the patient. The patient was provided an opportunity to ask questions and all were answered. The patient agreed with the plan and demonstrated an understanding of the instructions.   The patient was advised to call back or seek an in-person evaluation if the symptoms worsen or if the condition fails to improve as anticipated.  I provided 15 minutes of non-face-to-face time during this encounter.  The patient was located at home.  The provider was located at [place].   Kathryn Velazquez, PMHNP   Subjective:   Patient ID:  Kathryn Velazquez is a 34 y.o. (DOB Apr 02, 1986) female.  Chief Complaint:  Chief Complaint  Patient presents with  . Follow-up    h/o mood disturbance and anxiety    HPI Kathryn Velazquez presents for follow-up for mood and anxiety. She reports "everything has been pretty consistent." Mood has been stable. Denies depressed mood. Occ irritability. She reports that periods of elevated mood and increased spending have decreased. She reports that she has had very little impulsive behavior. She reports that she has some occ anxiety, typically in response to work stress. She reports that she occ has disrupted sleep when she is more anxious. Sleep is typically ok and will get 7-8 hours unless she awakens during the night. Middle of the night awakenings vary  in duration. Appetite has been ok. Energy and motivation have been good. Concentration varies day to day. She is not sure what triggers concentration difficulties. Denies SI.   Past Medication Trials: Lexapro- Reports that she did not see a significant difference (negative or positive). Reports taking it for most of the year. Prozac Pristiq- took only briefly due to insurance/cost. "I think I liked it." Depakote- Prescribed but never took.  Lamictal- Prescribed but never took. Latuda Xanax- Took in 2013 or 2014. "It's not a practical everyday medicine." May have taken 1 mg. Propranolol  Review of Systems:  Review of Systems  Gastrointestinal: Negative.   Musculoskeletal: Negative for gait problem.  Neurological: Negative for tremors and headaches.       Denies any involuntary movements  Psychiatric/Behavioral:       Please refer to HPI    Medications: I have reviewed the patient's current medications.  Current Outpatient Medications  Medication Sig Dispense Refill  . lurasidone (LATUDA) 40 MG TABS tablet Take 1 tablet (40 mg total) by mouth daily with supper. 30 tablet 5   No current facility-administered medications for this visit.    Medication Side Effects: None  Allergies: No Known Allergies  Past Medical History:  Diagnosis Date  . Anemia 11/10/2018  . Anxiety and depression   . Thrombocytosis ([place]) 11/10/2018    Family History  Problem Relation Age of Onset  . Aneurysm Mother   . Alcohol abuse Mother   . Hypertension Father   . Diabetes Brother     Social History   Socioeconomic History  . Marital status: Single  Spouse name: Not on file  . Number of children: Not on file  . Years of education: Not on file  . Highest education level: Not on file  Occupational History  . Not on file  Tobacco Use  . Smoking status: Current Some Day Smoker    Types: Cigarettes  . Smokeless tobacco: Never Used  . Tobacco comment: twice a month  Substance and  Sexual Activity  . Alcohol use: Yes    Comment: rare  . Drug use: No  . Sexual activity: Not Currently    Birth control/protection: None  Other Topics Concern  . Not on file  Social History Narrative  . Not on file   Social Determinants of Health   Financial Resource Strain:   . Difficulty of Paying Living Expenses: Not on file  Food Insecurity:   . Worried About Charity fundraiser in the Last Year: Not on file  . Ran Out of Food in the Last Year: Not on file  Transportation Needs:   . Lack of Transportation (Medical): Not on file  . Lack of Transportation (Non-Medical): Not on file  Physical Activity:   . Days of Exercise per Week: Not on file  . Minutes of Exercise per Session: Not on file  Stress:   . Feeling of Stress : Not on file  Social Connections:   . Frequency of Communication with Friends and Family: Not on file  . Frequency of Social Gatherings with Friends and Family: Not on file  . Attends Religious Services: Not on file  . Active Member of Clubs or Organizations: Not on file  . Attends Archivist Meetings: Not on file  . Marital Status: Not on file  Intimate Partner Violence:   . Fear of Current or Ex-Partner: Not on file  . Emotionally Abused: Not on file  . Physically Abused: Not on file  . Sexually Abused: Not on file    Past Medical History, Surgical history, Social history, and Family history were reviewed and updated as appropriate.   Please see review of systems for further details on the patient's review from today.   Objective:   Physical Exam:  Wt 251 lb (113.9 kg)   BMI 47.43 kg/m   Physical Exam Neurological:     Mental Status: She is alert and oriented to person, place, and time.     Cranial Nerves: No dysarthria.  Psychiatric:        Attention and Perception: Attention and perception normal.        Mood and Affect: Mood normal.        Speech: Speech normal.        Behavior: Behavior is cooperative.        Thought  Content: Thought content normal. Thought content is not paranoid or delusional. Thought content does not include homicidal or suicidal ideation. Thought content does not include homicidal or suicidal plan.        Cognition and Memory: Cognition and memory normal.        Judgment: Judgment normal.     Comments: Insight intact     Lab Review:     Component Value Date/Time   NA 142 12/12/2019 1541   K 4.1 12/12/2019 1541   CL 104 12/12/2019 1541   CO2 22 12/12/2019 1541   GLUCOSE 77 12/12/2019 1541   BUN 12 12/12/2019 1541   CREATININE 0.69 12/12/2019 1541   CALCIUM 9.6 12/12/2019 1541   PROT 8.0 12/12/2019 1541   ALBUMIN  4.3 12/12/2019 1541   AST 11 12/12/2019 1541   ALT 13 12/12/2019 1541   ALKPHOS 125 (H) 12/12/2019 1541   BILITOT 0.3 12/12/2019 1541   GFRNONAA 116 12/12/2019 1541   GFRAA 133 12/12/2019 1541       Component Value Date/Time   WBC 11.8 (H) 12/12/2019 1541   RBC 4.93 12/12/2019 1541   HGB 11.0 (L) 12/12/2019 1541   HCT 34.5 12/12/2019 1541   PLT 536 (H) 12/12/2019 1541   MCV 70 (L) 12/12/2019 1541   MCH 22.3 (L) 12/12/2019 1541   MCHC 31.9 12/12/2019 1541   RDW 16.9 (H) 12/12/2019 1541   LYMPHSABS 2.9 12/12/2019 1541   EOSABS 0.2 12/12/2019 1541   BASOSABS 0.0 12/12/2019 1541    No results found for: POCLITH, LITHIUM   No results found for: PHENYTOIN, PHENOBARB, VALPROATE, CBMZ   .res Assessment: Plan:   Will continue Latuda 40 mg daily for mood signs and symptoms since patient works 2 days continues to be effective mood symptoms and she is tolerating it without difficulty. Patient to follow-up in 6 months or sooner if clinically indicated. Patient advised to contact office with any questions, adverse effects, or acute worsening in signs and symptoms.  [name] was seen today for follow-up.  Diagnoses and all orders for this visit:  Bipolar II disorder ([place]) -     lurasidone (LATUDA) 40 MG TABS tablet; Take 1 tablet (40 mg total) by mouth daily  with supper.  Anxiety disorder, unspecified type -     lurasidone (LATUDA) 40 MG TABS tablet; Take 1 tablet (40 mg total) by mouth daily with supper.    Please see After Visit Summary for patient specific instructions.  Future Appointments  Date Time Provider [place]  01/25/2020  3:00 PM Girtha Rm, NP-C [facility]  12/12/2020  2:45 PM Henson, Vickie L, NP-C [facility]    No orders of the defined types were placed in this encounter.     -------------------------------

## 2020-01-24 NOTE — Progress Notes (Deleted)
   Subjective:    Patient ID: Kathryn Velazquez, female    DOB: [birth year], 34 y.o.   MRN: [id number]  HPI No chief complaint on file.   Here for follow up on thrombocytosis and IDA.  She has been taking an iron supplement.   History of thrombocytosis and has ?seen hematology?  Denies fever, chills, headache, dizziness, fatigue, paresthesias, chest pain, palpitations, abdominal pain, N/V/D.   Did she get an appt with gyn for pap?  Review of Systems Pertinent positives and negatives in the history of present illness.     Objective:   Physical Exam There were no vitals taken for this visit.        Assessment & Plan:  Thrombocytosis (HCC)  Iron deficiency anemia, unspecified iron deficiency anemia type

## 2020-01-25 ENCOUNTER — Institutional Professional Consult (permissible substitution): Payer: BC Managed Care – PPO | Admitting: Family Medicine

## 2020-02-05 ENCOUNTER — Other Ambulatory Visit: Payer: Self-pay

## 2020-02-05 ENCOUNTER — Ambulatory Visit
Admission: EM | Admit: 2020-02-05 | Discharge: 2020-02-05 | Disposition: A | Discharge status: Home / Self Care | Payer: BC Managed Care – PPO | Attending: Physician Assistant | Admitting: Physician Assistant

## 2020-02-05 DIAGNOSIS — K0889 Other specified disorders of teeth and supporting structures: Secondary | ICD-10-CM

## 2020-02-05 MED ORDER — AMOXICILLIN-POT CLAVULANATE 875-125 MG PO TABS: 1.0000 | ORAL_TABLET | Freq: Two times a day (BID) | ORAL | 0 refills | Status: DC

## 2020-02-05 MED ORDER — CHLORHEXIDINE GLUCONATE 0.12 % MT SOLN: 5.0000 mL | Freq: Two times a day (BID) | OROMUCOSAL | 0 refills | Status: DC

## 2020-02-05 NOTE — ED Provider Notes (Signed)
[facility]    CSN: [id number] Arrival date & time: 02/05/20  1432      History   Chief Complaint Chief Complaint  Patient presents with  . Dental Pain    HPI Kathryn Velazquez is a 34 y.o. female.   34 year old female comes in for worsening dental pain. Has had 5 days of dental pain that is intermittent, more sensitive to temperature change. She saw her dentist and was told needed a root canal. She was referred to an endodontist, but was out of network. 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 She woke up this morning with worsening pain/sensitivity that is more constant. States fiance noticed some facial swelling this morning. Denies fever, chills. Ibuprofen 800mg  had temporary relief when symptom first started, but not today since symptoms worsened.       Past Medical History:  Diagnosis Date  . Anemia 11/10/2018  . Anxiety and depression   . Thrombocytosis ([place]) 11/10/2018    Patient Active Problem List   Diagnosis Date Noted  . Iron deficiency anemia 12/12/2019  . 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 Mixed hyperlipidemia 11/20/2019  . Anemia 11/10/2018  . Thrombocytosis ([place]) 11/10/2018  . Morbid obesity ([place]) 11/09/2018  . Anxiety and depression     Past Surgical History:  Procedure Laterality Date  . CESAREAN SECTION      OB History   No obstetric history on file.      Home Medications    Prior to Admission medications   Medication Sig Start Date End Date Taking? Authorizing Provider  amoxicillin-clavulanate (AUGMENTIN) 875-125 MG tablet Take 1 tablet by mouth every 12 (twelve) hours. 02/05/20   Tasia Catchings, Sharmeka Palmisano V, PA-C  chlorhexidine (PERIDEX) 0.12 % solution Use as directed 5 mLs in the mouth or throat 2 (two) times daily. 02/05/20   Tasia Catchings, Huey Scalia V, PA-C  lurasidone (LATUDA) 40 MG TABS tablet Take 1 tablet (40 mg total) by mouth daily with supper. 01/24/20   Thayer Headings, PMHNP    Family History Family History  Problem Relation Age of Onset  . Aneurysm Mother   . Alcohol abuse Mother   . Hypertension Father   . Diabetes Brother     Social History Social History   Tobacco Use  . Smoking status: Current Some Day Smoker    Types: Cigarettes  . Smokeless tobacco: Never Used  . Tobacco comment: twice a month  Substance Use Topics  . Alcohol use: Yes    Comment: rare  . Drug use: No     Allergies   Patient has no known allergies.   Review of Systems Review of Systems  Reason unable to perform ROS: See HPI as above.     Physical Exam Triage Vital Signs ED Triage Vitals  Enc Vitals Group     BP 02/05/20 1436 132/84     Pulse Rate 02/05/20 1436 83     Resp 02/05/20 1436 18     Temp 02/05/20 1436 98 F (36.7 C)     Temp Source 02/05/20 1436 Oral     SpO2 02/05/20 1436 98 %     Weight --      Height --      Head Circumference --      Peak Flow --      Pain Score 02/05/20 1448 6     Pain Loc --  Pain Edu? --      Excl. in [place]? --    No data found.  Updated Vital Signs BP 132/84 (BP Location: Left Arm)   Pulse 83   Temp 98 F (36.7 C) (Oral)   Resp 18   LMP 01/28/2020   SpO2 98%      Physical Exam Constitutional:      General: She is not in acute distress.    Appearance: She is well-developed. She is not ill-appearing, toxic-appearing or diaphoretic.  HENT:     Head: Normocephalic and atraumatic.     Jaw: No trismus.     Mouth/Throat:     Mouth: Mucous membranes are moist.     Pharynx: Oropharynx is clear. Uvula midline. No uvula swelling.     Tonsils: No tonsillar exudate.      Comments: Floor of mouth soft to palpation. No facial swelling.  Musculoskeletal:     Cervical back: Normal range of motion and neck supple.  Skin:    General: Skin is warm and dry.  Neurological:     Mental Status: She is alert and oriented to person, place, and time.      UC Treatments / Results  Labs (all labs  ordered are listed, but only abnormal results are displayed) Labs Reviewed - No data to display  EKG   Radiology No results found.  Procedures Procedures (including critical care time)  Medications Ordered in UC Medications - No data to display  Initial Impression / Assessment and Plan / UC Course  I have reviewed the triage vital signs and the nursing notes.  Pertinent labs & imaging results that were available during my care of the patient were reviewed by me and considered in my medical decision making (see chart for details).    Start antibiotics for possible dental infection. Symptomatic treatment as needed. Discussed with patient symptoms can return if dental problem is not addressed. Follow up with dentist for further evaluation and treatment of dental pain. Resources given. Return precautions given.   Final Clinical Impressions(s) / UC Diagnoses   Final diagnoses:  Pain, dental   ED Prescriptions    Medication Sig Dispense Auth. Provider   amoxicillin-clavulanate (AUGMENTIN) 875-125 MG tablet Take 1 tablet by mouth every 12 (twelve) hours. 14 tablet Lendell Gallick V, PA-C   chlorhexidine (PERIDEX) 0.12 % solution Use as directed 5 mLs in the mouth or throat 2 (two) times daily. 120 mL Tasia Catchings, Delaynie Stetzer V, PA-C     I have reviewed the PDMP during this encounter.   Ok Edwards, PA-C 02/05/20 1507

## 2020-02-05 NOTE — ED Triage Notes (Signed)
Patient presents to UC with dental pain of right upper molar that has been off/on since about 5 days ago. She has consulted with her dentist about needing a root canal, but has been unable to find a Endodontist at this time. Taking ibuprofen with some relief.

## 2020-02-05 NOTE — Discharge Instructions (Signed)
Start Augmentin as directed for possible dental infection. Peridex or over the counter mouth was to prevent further infection. Ibuprofen 800mg  three times a day for pain. Follow up with dentist for further treatment and evaluation. If experiencing swelling of the throat, trouble breathing, trouble swallowing, leaning forward to breath, drooling, go to the emergency department for further evaluation.

## 2020-02-06 ENCOUNTER — Telehealth: Payer: Self-pay | Admitting: Internal Medicine

## 2020-02-06 NOTE — Telephone Encounter (Signed)
Pt states she just started taking iron pills yesterday (Sunday) so does she need to wait 4 more weeks before coming in

## 2020-02-06 NOTE — Telephone Encounter (Signed)
That is fine unless she is having any symptoms of anemia including fatigue, dizziness, chest pain, shortness of breath. We need to recheck her platelets as well since they were elevated. Having elevated platelets can increase the risk of blood clots. I do not recommend waiting longer than 4 weeks before having labs rechecked.

## 2020-02-07 NOTE — Telephone Encounter (Signed)
Pt is scheduled for 4 weeks

## 2020-03-08 ENCOUNTER — Ambulatory Visit: Payer: BC Managed Care – PPO | Admitting: Family Medicine

## 2020-03-12 ENCOUNTER — Encounter: Payer: Self-pay | Admitting: Family Medicine

## 2020-03-22 ENCOUNTER — Other Ambulatory Visit: Payer: Self-pay | Admitting: Internal Medicine

## 2020-03-22 ENCOUNTER — Other Ambulatory Visit: Payer: BC Managed Care – PPO

## 2020-03-22 ENCOUNTER — Other Ambulatory Visit: Payer: Self-pay

## 2020-03-22 DIAGNOSIS — D473 Essential (hemorrhagic) thrombocythemia: Secondary | ICD-10-CM

## 2020-03-22 DIAGNOSIS — D509 Iron deficiency anemia, unspecified: Secondary | ICD-10-CM

## 2020-03-22 DIAGNOSIS — D75839 Thrombocytosis, unspecified: Secondary | ICD-10-CM

## 2020-03-23 LAB — CBC WITH DIFFERENTIAL/PLATELET
Basophils Absolute: 0 10*3/uL (ref 0.0–0.2)
Basos: 0 %
EOS (ABSOLUTE): 0.2 10*3/uL (ref 0.0–0.4)
Eos: 2 %
Hematocrit: 33.6 % — ABNORMAL LOW (ref 34.0–46.6)
Hemoglobin: 10.8 g/dL — ABNORMAL LOW (ref 11.1–15.9)
Immature Grans (Abs): 0.1 10*3/uL (ref 0.0–0.1)
Immature Granulocytes: 1 %
Lymphocytes Absolute: 2.2 10*3/uL (ref 0.7–3.1)
Lymphs: 21 %
MCH: 22.5 pg — ABNORMAL LOW (ref 26.6–33.0)
MCHC: 32.1 g/dL (ref 31.5–35.7)
MCV: 70 fL — ABNORMAL LOW (ref 79–97)
Monocytes Absolute: 0.8 10*3/uL (ref 0.1–0.9)
Monocytes: 7 %
Neutrophils Absolute: 7.2 10*3/uL — ABNORMAL HIGH (ref 1.4–7.0)
Neutrophils: 69 %
Platelets: 539 10*3/uL — ABNORMAL HIGH (ref 150–450)
RBC: 4.81 x10E6/uL (ref 3.77–5.28)
RDW: 16.1 % — ABNORMAL HIGH (ref 11.7–15.4)
WBC: 10.4 10*3/uL (ref 3.4–10.8)

## 2020-03-23 LAB — COMPREHENSIVE METABOLIC PANEL
ALT: 12 IU/L (ref 0–32)
AST: 17 IU/L (ref 0–40)
Albumin/Globulin Ratio: 1.1 — ABNORMAL LOW (ref 1.2–2.2)
Albumin: 3.8 g/dL (ref 3.8–4.8)
Alkaline Phosphatase: 111 IU/L (ref 39–117)
BUN/Creatinine Ratio: 14 (ref 9–23)
BUN: 11 mg/dL (ref 6–20)
Bilirubin Total: 0.4 mg/dL (ref 0.0–1.2)
CO2: 20 mmol/L (ref 20–29)
Calcium: 9.6 mg/dL (ref 8.7–10.2)
Chloride: 103 mmol/L (ref 96–106)
Creatinine, Ser: 0.79 mg/dL (ref 0.57–1.00)
GFR calc Af Amer: 115 mL/min/{1.73_m2} (ref >59)
GFR calc non Af Amer: 99 mL/min/{1.73_m2} (ref >59)
Globulin, Total: 3.5 g/dL (ref 1.5–4.5)
Glucose: 87 mg/dL (ref 65–99)
Potassium: 4.6 mmol/L (ref 3.5–5.2)
Sodium: 138 mmol/L (ref 134–144)
Total Protein: 7.3 g/dL (ref 6.0–8.5)

## 2020-03-23 LAB — IRON AND TIBC
Iron Saturation: 8 % — CL (ref 15–55)
Iron: 29 ug/dL (ref 27–159)
Total Iron Binding Capacity: 355 ug/dL (ref 250–450)
UIBC: 326 ug/dL (ref 131–425)

## 2020-03-23 LAB — FOLATE: Folate: 8.5 ng/mL (ref >3.0)

## 2020-03-23 LAB — FERRITIN: Ferritin: 29 ng/mL (ref 15–150)

## 2020-03-23 LAB — VITAMIN B12: Vitamin B-12: 935 pg/mL (ref 232–1245)

## 2020-03-26 NOTE — Progress Notes (Signed)
Please put her iron supplement in the medication list (how much is she taking exactly?) and let her know that I am referring her to a blood specialist, hematologist, for her persistently elevated platelets. I was hoping this would improve with the addition of oral iron but it has not and I would like to have her further evaluated. Diagnosis is thrombocytosis.

## 2020-03-27 NOTE — Progress Notes (Signed)
OTC is just as good and yes, she should take it daily. Thank you.

## 2020-03-27 NOTE — Progress Notes (Signed)
I am referring her due to her elevated platelets and now for her iron. She needs the referral still. Please let her know this is a different issue.

## 2020-04-02 ENCOUNTER — Telehealth: Payer: Self-pay | Admitting: Hematology and Oncology

## 2020-04-02 NOTE — Telephone Encounter (Signed)
Received a new hem referral from Harland Dingwall, NP from [facility] for thrombocytosis. Pt has been cld and scheduled to see Dr. Lindi Adie on 5/26 at 345pm. Pt needed the latest appt in the afternoon. She's been made aware to arrive 15 minutes early.

## 2020-04-03 ENCOUNTER — Telehealth: Payer: BC Managed Care – PPO | Admitting: Family Medicine

## 2020-04-03 ENCOUNTER — Other Ambulatory Visit: Payer: Self-pay

## 2020-04-03 ENCOUNTER — Encounter: Payer: Self-pay | Admitting: Family Medicine

## 2020-04-03 VITALS — Temp 99.2°F | Wt 251.0 lb

## 2020-04-03 DIAGNOSIS — U071 COVID-19: Secondary | ICD-10-CM | POA: Diagnosis not present

## 2020-04-03 NOTE — Progress Notes (Signed)
   Subjective:    Patient ID: Kathryn Velazquez, female    DOB: Dec 09, 1985, 34 y.o.   MRN: [id number]  HPI   Documentation for virtual audio and video telecommunications through [place] encounter:  The patient was located at home. The provider was located in the office. The patient did consent to this visit and is aware of possible charges through their insurance for this visit.  The other persons participating in this telemedicine service were none. Time spent on call was 8 minutes This virtual service is not related to other E/M service within previous 7 days. She states that she developed a headache Saturday evening and woke up Sunday with myalgias, fatigue followed by fever, chills, rhinorrhea and sore throat.  Apparently earlier today she went for Covid testing and is positive.   Review of Systems     Objective:   Physical Exam Alert and visually in no distress.  Her breathing pattern appeared normal.       Assessment & Plan:  COVID-19 I discussed treating the symptoms that she is having with Tylenol/Advil/Robitussin-DM and NyQuil at night.  If her symptoms get worse specifically increasing cough and shortness of breath, she will need to be seen probably in the emergency room.  She is to stay sequestered for at least 10 days.  Her fianc has apparently had his shot so he is okay but the son needs to be staying home.  She was aware that.  If her symptoms resolving at 10 days, she may return to normal physical activities.  She expressed understanding of all this.

## 2020-04-09 ENCOUNTER — Telehealth: Payer: Self-pay | Admitting: Hematology and Oncology

## 2020-04-09 NOTE — Telephone Encounter (Signed)
I received a staff msg to cancel the pt's appt on 5/26 w/Dr. Lindi Adie.

## 2020-04-11 ENCOUNTER — Inpatient Hospital Stay: Payer: BC Managed Care – PPO | Admitting: Hematology and Oncology

## 2020-05-28 ENCOUNTER — Encounter: Payer: Self-pay | Admitting: Family Medicine

## 2020-05-28 DIAGNOSIS — Z0000 Encounter for general adult medical examination without abnormal findings: Secondary | ICD-10-CM | POA: Insufficient documentation

## 2020-05-28 DIAGNOSIS — Z79899 Other long term (current) drug therapy: Secondary | ICD-10-CM

## 2020-05-28 HISTORY — DX: Other long term (current) drug therapy: Z79.899

## 2020-07-27 ENCOUNTER — Ambulatory Visit: Payer: BC Managed Care – PPO | Admitting: Family Medicine

## 2020-07-27 ENCOUNTER — Encounter: Payer: Self-pay | Admitting: Family Medicine

## 2020-07-27 ENCOUNTER — Other Ambulatory Visit: Payer: Self-pay | Admitting: Family Medicine

## 2020-07-27 VITALS — BP 120/70 | HR 67 | Wt 259.2 lb

## 2020-07-27 DIAGNOSIS — N926 Irregular menstruation, unspecified: Secondary | ICD-10-CM

## 2020-07-27 DIAGNOSIS — Z3201 Encounter for pregnancy test, result positive: Secondary | ICD-10-CM

## 2020-07-27 DIAGNOSIS — D509 Iron deficiency anemia, unspecified: Secondary | ICD-10-CM

## 2020-07-27 DIAGNOSIS — D75839 Thrombocytosis, unspecified: Secondary | ICD-10-CM

## 2020-07-27 DIAGNOSIS — D473 Essential (hemorrhagic) thrombocythemia: Secondary | ICD-10-CM | POA: Diagnosis not present

## 2020-07-27 DIAGNOSIS — Z3A01 Less than 8 weeks gestation of pregnancy: Secondary | ICD-10-CM

## 2020-07-27 LAB — POCT URINE PREGNANCY: Preg Test, Ur: POSITIVE — AB

## 2020-07-27 NOTE — Progress Notes (Signed)
   Subjective:    Patient ID: Kathryn Velazquez, female    DOB: 1986-02-23, 34 y.o.   MRN: [id number]  HPI Chief Complaint  Patient presents with  . possible pregnant    possible pregnant. positive test at home wednesday. missed period   She is here to have a pregnancy test. States she had a positive test at home. Denies abdominal pain, N/V/D or urinary symptoms. No vaginal discharge.   She has a 33 year old. Was not trying to conceive but states she and her husband are happy about it.   LMP: 06/25/2020  She is not on any medications currently. No alcohol or drugs. Smokes occasionally but will stop now.   No cats in the house.   Hx of thrombocytosis and anemia which I referred her to hematology for in May. She did not go due to having Covid at the time.  States she will call and schedule.   Reviewed allergies, medications, past medical, surgical, family, and social history.    Review of Systems Pertinent positives and negatives in the history of present illness.     Objective:   Physical Exam BP 120/70   Pulse 67   Wt 259 lb 3.2 oz (117.6 kg)   LMP 06/20/2020   BMI 48.98 kg/m   Alert and oriented and in no acute distress.       Assessment & Plan:  Positive urine pregnancy test - Plan: Beta hCG quant (ref lab) -This is her second pregnancy. She has a 33 year old. She feels good about being pregnant but is somewhat surprised. She is not on any harmful medications. No alcohol or drug use. Smokes occasionally but plans to stop now. I will provide a handout of commonly asked questions for pregnancy. Recommend she start a prenatal vitamin with folic acid right away. Discussed taking good care of herself with a healthy diet. She will call and schedule with an OB/GYN  Missed period - Plan: POCT urine pregnancy, Beta hCG quant (ref lab)  Thrombocytosis ([place]) - Plan: CBC with Differential/Platelet, Comprehensive metabolic panel -Persistently elevated platelets. She did not  schedule hematology follow-up after our last visit but she will call and schedule now.  Iron deficiency anemia, unspecified iron deficiency anemia type - Plan: CBC with Differential/Platelet, Vitamin B12, Iron, TIBC and Ferritin Panel, Folate -Check CBC and iron studies as well as B12 and folate in follow-up.  Morbid obesity ([place]) -Recommend healthy, well-balanced diet

## 2020-07-27 NOTE — Patient Instructions (Addendum)
Please call [facility] [phone] to schedule a visit   Obgyn Offices:   [facility] [address] [place], [place] [phone]  [facility] Address: [address] [place], [place] [address] Phone: [phone]  [place] [address] [place], [place] [address] Phone: [phone]  [place], [place] [address] Phone: [phone]      Commonly Asked Questions During Pregnancy  Cats: A parasite can be excreted in cat feces.  To avoid exposure you need to have another person empty the little box.  If you must empty the litter box you will need to wear gloves.  Wash your hands after handling your cat.  This parasite can also be found in raw or undercooked meat so this should also be avoided.  Colds, Sore Throats, Flu: Please check your medication sheet to see what you can take for symptoms.  If your symptoms are unrelieved by these medications please call the office.  Dental Work: Most any dental work Investment banker, corporate recommends is permitted.  X-rays should only be taken during the first trimester if absolutely necessary.  Your abdomen should be shielded with a lead apron during all x-rays.  Please notify your provider prior to receiving any x-rays.  Novocaine is fine; gas is not recommended.  If your dentist requires a note from [place] prior to dental work please call the office and we will provide one for you.  Exercise: Exercise is an important part of staying healthy during your pregnancy.  You may continue most exercises you were accustomed to prior to pregnancy.  Later in your pregnancy you will most likely notice you have difficulty with activities requiring balance like riding a bicycle.  It is important that you listen to your body and avoid activities that put you at a higher risk of falling.  Adequate rest and staying well hydrated are a  must!  If you have questions about the safety of specific activities ask your provider.    Exposure to Children with illness: Try to avoid obvious exposure; report any symptoms to [place] when noted,  If you have chicken pos, red measles or mumps, you should be immune to these diseases.   Please do not take any vaccines while pregnant unless you have checked with your OB provider.  Fetal Movement: After 28 weeks we recommend you do "kick counts" twice daily.  Lie or sit down in a calm quiet environment and count your baby movements "kicks".  You should feel your baby at least 10 times per hour.  If you have not felt 10 kicks within the first hour get up, walk around and have something sweet to eat or drink then repeat for an additional hour.  If count remains less than 10 per hour notify your provider.  Fumigating: Follow your pest control agent's advice as to how long to stay out of your home.  Ventilate the area well before re-entering.  Hemorrhoids:   Most over-the-counter preparations can be used during pregnancy.  Check your medication to see what is safe to use.  It is important to use a stool softener or fiber in your diet and to drink lots of liquids.  If hemorrhoids seem to be getting worse please call the office.   Hot Tubs:  Hot tubs Jacuzzis and saunas are not recommended while pregnant.  These increase your internal body temperature and should be avoided.  Intercourse:  Sexual intercourse is safe during pregnancy as long as you are comfortable, unless otherwise advised by your provider.  Spotting may occur after intercourse; report any bright red bleeding that is heavier than spotting.  Labor:  If you know that you are in labor, please go to the hospital.  If you are unsure, please call the office and let us help you decide what to do.  Lifting, straining, etc:  If your job requires heavy lifting or straining please check with your provider for any limitations.  Generally, you should not lift  items heavier than that you can lift simply with your hands and arms (no back muscles)  Painting:  Paint fumes do not harm your pregnancy, but may make you ill and should be avoided if possible.  Latex or water based paints have less odor than oils.  Use adequate ventilation while painting.  Permanents & Hair Color:  Chemicals in hair dyes are not recommended as they cause increase hair dryness which can increase hair loss during pregnancy.  " Highlighting" and permanents are allowed.  Dye may be absorbed differently and permanents may not hold as well during pregnancy.  Sunbathing:  Use a sunscreen, as skin burns easily during pregnancy.  Drink plenty of fluids; avoid over heating.  Tanning Beds:  Because their possible side effects are still unknown, tanning beds are not recommended.  Ultrasound Scans:  Routine ultrasounds are performed at approximately 20 weeks.  You will be able to see your baby's general anatomy an if you would like to know the gender this can usually be determined as well.  If it is questionable when you conceived you may also receive an ultrasound early in your pregnancy for dating purposes.  Otherwise ultrasound exams are not routinely performed unless there is a medical necessity.  Although you can request a scan we ask that you pay for it when conducted because insurance does not cover " patient request" scans.  Work: If your pregnancy proceeds without complications you may work until your due date, unless your physician or employer advises otherwise.  Round Ligament Pain/Pelvic Discomfort:  Sharp, shooting pains not associated with bleeding are fairly common, usually occurring in the second trimester of pregnancy.  They tend to be worse when standing up or when you remain standing for long periods of time.  These are the result of pressure of certain pelvic ligaments called "round ligaments".  Rest, Tylenol and heat seem to be the most effective relief.  As the womb and  fetus grow, they rise out of the pelvis and the discomfort improves.  Please notify the office if your pain seems different than that described.  It may represent a more serious condition.

## 2020-07-28 LAB — CBC WITH DIFFERENTIAL/PLATELET
Basophils Absolute: 0 10*3/uL (ref 0.0–0.2)
Basos: 0 %
EOS (ABSOLUTE): 0.2 10*3/uL (ref 0.0–0.4)
Eos: 2 %
Hematocrit: 34.2 % (ref 34.0–46.6)
Hemoglobin: 10.3 g/dL — ABNORMAL LOW (ref 11.1–15.9)
Immature Grans (Abs): 0 10*3/uL (ref 0.0–0.1)
Immature Granulocytes: 0 %
Lymphocytes Absolute: 2.6 10*3/uL (ref 0.7–3.1)
Lymphs: 22 %
MCH: 21.3 pg — ABNORMAL LOW (ref 26.6–33.0)
MCHC: 30.1 g/dL — ABNORMAL LOW (ref 31.5–35.7)
MCV: 71 fL — ABNORMAL LOW (ref 79–97)
Monocytes Absolute: 0.9 10*3/uL (ref 0.1–0.9)
Monocytes: 7 %
Neutrophils Absolute: 8.4 10*3/uL — ABNORMAL HIGH (ref 1.4–7.0)
Neutrophils: 69 %
Platelets: 550 10*3/uL — ABNORMAL HIGH (ref 150–450)
RBC: 4.83 x10E6/uL (ref 3.77–5.28)
RDW: 16.4 % — ABNORMAL HIGH (ref 11.7–15.4)
WBC: 12.2 10*3/uL — ABNORMAL HIGH (ref 3.4–10.8)

## 2020-07-28 LAB — COMPREHENSIVE METABOLIC PANEL
ALT: 20 IU/L (ref 0–32)
AST: 16 IU/L (ref 0–40)
Albumin/Globulin Ratio: 1.1 — ABNORMAL LOW (ref 1.2–2.2)
Albumin: 3.9 g/dL (ref 3.8–4.8)
Alkaline Phosphatase: 107 IU/L (ref 48–121)
BUN/Creatinine Ratio: 13 (ref 9–23)
BUN: 14 mg/dL (ref 6–20)
Bilirubin Total: <0.2 mg/dL (ref 0.0–1.2)
CO2: 20 mmol/L (ref 20–29)
Calcium: 9.8 mg/dL (ref 8.7–10.2)
Chloride: 104 mmol/L (ref 96–106)
Creatinine, Ser: 1.05 mg/dL — ABNORMAL HIGH (ref 0.57–1.00)
GFR calc Af Amer: 81 mL/min/{1.73_m2} (ref >59)
GFR calc non Af Amer: 70 mL/min/{1.73_m2} (ref >59)
Globulin, Total: 3.6 g/dL (ref 1.5–4.5)
Glucose: 91 mg/dL (ref 65–99)
Potassium: 4.4 mmol/L (ref 3.5–5.2)
Sodium: 139 mmol/L (ref 134–144)
Total Protein: 7.5 g/dL (ref 6.0–8.5)

## 2020-07-28 LAB — IRON,TIBC AND FERRITIN PANEL
Ferritin: 20 ng/mL (ref 15–150)
Iron Saturation: 5 % — CL (ref 15–55)
Iron: 20 ug/dL — ABNORMAL LOW (ref 27–159)
Total Iron Binding Capacity: 374 ug/dL (ref 250–450)
UIBC: 354 ug/dL (ref 131–425)

## 2020-07-28 LAB — VITAMIN B12: Vitamin B-12: 742 pg/mL (ref 232–1245)

## 2020-07-28 LAB — BETA HCG QUANT (REF LAB): hCG Quant: 327 m[IU]/mL

## 2020-07-28 LAB — FOLATE: Folate: 4.2 ng/mL (ref >3.0)

## 2020-07-29 NOTE — Progress Notes (Signed)
I put in a new referral to hematology. She needs a visit very soon now that she is pregnant. Please see that this is urgent. Thanks.

## 2020-07-30 ENCOUNTER — Telehealth: Payer: Self-pay | Admitting: Hematology and Oncology

## 2020-07-30 NOTE — Telephone Encounter (Signed)
Received a new pt referral from [facility] for thrombocytosis in pregnancy. Pt has been cld and scheduled to see Dr. Lorenso Courier on 9/30 at 1pm. Pt aware to arrive 30 minutes early.

## 2020-07-31 ENCOUNTER — Telehealth: Payer: Self-pay | Admitting: Hematology

## 2020-07-31 NOTE — Telephone Encounter (Signed)
Receive a call from Ms. Stradford to r/s her hem appt to 9/23 at 1pm to see Dr. Irene Limbo.

## 2020-08-09 ENCOUNTER — Other Ambulatory Visit: Payer: Self-pay | Admitting: Obstetrics and Gynecology

## 2020-08-09 ENCOUNTER — Telehealth: Payer: Self-pay | Admitting: Hematology

## 2020-08-09 ENCOUNTER — Inpatient Hospital Stay: Payer: BC Managed Care – PPO | Attending: Hematology and Oncology | Admitting: Hematology

## 2020-08-09 ENCOUNTER — Inpatient Hospital Stay: Payer: BC Managed Care – PPO

## 2020-08-09 ENCOUNTER — Other Ambulatory Visit: Payer: Self-pay

## 2020-08-09 VITALS — BP 140/86 | HR 91 | Temp 97.6°F | Resp 18 | Ht 61.0 in | Wt 259.7 lb

## 2020-08-09 DIAGNOSIS — D75839 Thrombocytosis, unspecified: Secondary | ICD-10-CM

## 2020-08-09 DIAGNOSIS — O99111 Other diseases of the blood and blood-forming organs and certain disorders involving the immune mechanism complicating pregnancy, first trimester: Secondary | ICD-10-CM | POA: Insufficient documentation

## 2020-08-09 DIAGNOSIS — D473 Essential (hemorrhagic) thrombocythemia: Secondary | ICD-10-CM | POA: Diagnosis present

## 2020-08-09 DIAGNOSIS — Z3200 Encounter for pregnancy test, result unknown: Secondary | ICD-10-CM

## 2020-08-09 DIAGNOSIS — N926 Irregular menstruation, unspecified: Secondary | ICD-10-CM

## 2020-08-09 DIAGNOSIS — D509 Iron deficiency anemia, unspecified: Secondary | ICD-10-CM

## 2020-08-09 LAB — CMP (CANCER CENTER ONLY)
ALT: 13 U/L (ref 0–44)
AST: 10 U/L — ABNORMAL LOW (ref 15–41)
Albumin: 3 g/dL — ABNORMAL LOW (ref 3.5–5.0)
Alkaline Phosphatase: 99 U/L (ref 38–126)
Anion gap: 3 — ABNORMAL LOW (ref 5–15)
BUN: 11 mg/dL (ref 6–20)
CO2: 25 mmol/L (ref 22–32)
Calcium: 9.2 mg/dL (ref 8.9–10.3)
Chloride: 106 mmol/L (ref 98–111)
Creatinine: 0.73 mg/dL (ref 0.44–1.00)
GFR, Est AFR Am: >60 mL/min (ref >60)
GFR, Estimated: >60 mL/min (ref >60)
Glucose, Bld: 94 mg/dL (ref 70–99)
Potassium: 3.8 mmol/L (ref 3.5–5.1)
Sodium: 134 mmol/L — ABNORMAL LOW (ref 135–145)
Total Bilirubin: <0.2 mg/dL — ABNORMAL LOW (ref 0.3–1.2)
Total Protein: 7.8 g/dL (ref 6.5–8.1)

## 2020-08-09 LAB — CBC WITH DIFFERENTIAL/PLATELET
Abs Immature Granulocytes: 0.05 10*3/uL (ref 0.00–0.07)
Basophils Absolute: 0 10*3/uL (ref 0.0–0.1)
Basophils Relative: 0 %
Eosinophils Absolute: 0.2 10*3/uL (ref 0.0–0.5)
Eosinophils Relative: 2 %
HCT: 33.1 % — ABNORMAL LOW (ref 36.0–46.0)
Hemoglobin: 10.4 g/dL — ABNORMAL LOW (ref 12.0–15.0)
Immature Granulocytes: 0 %
Lymphocytes Relative: 21 %
Lymphs Abs: 2.4 10*3/uL (ref 0.7–4.0)
MCH: 22.2 pg — ABNORMAL LOW (ref 26.0–34.0)
MCHC: 31.4 g/dL (ref 30.0–36.0)
MCV: 70.6 fL — ABNORMAL LOW (ref 80.0–100.0)
Monocytes Absolute: 0.9 10*3/uL (ref 0.1–1.0)
Monocytes Relative: 8 %
Neutro Abs: 8 10*3/uL — ABNORMAL HIGH (ref 1.7–7.7)
Neutrophils Relative %: 69 %
Platelets: 515 10*3/uL — ABNORMAL HIGH (ref 150–400)
RBC: 4.69 MIL/uL (ref 3.87–5.11)
RDW: 18.1 % — ABNORMAL HIGH (ref 11.5–15.5)
WBC: 11.6 10*3/uL — ABNORMAL HIGH (ref 4.0–10.5)
nRBC: 0 % (ref 0.0–0.2)

## 2020-08-09 LAB — IRON AND TIBC
Iron: 22 ug/dL — ABNORMAL LOW (ref 41–142)
Saturation Ratios: 5 % — ABNORMAL LOW (ref 21–57)
TIBC: 419 ug/dL (ref 236–444)
UIBC: 397 ug/dL — ABNORMAL HIGH (ref 120–384)

## 2020-08-09 LAB — FERRITIN: Ferritin: 18 ng/mL (ref 11–307)

## 2020-08-09 MED ORDER — POLYSACCHARIDE IRON COMPLEX 150 MG PO CAPS: 150.0000 mg | ORAL_CAPSULE | Freq: Two times a day (BID) | ORAL | 6 refills | Status: DC

## 2020-08-09 NOTE — Progress Notes (Signed)
HEMATOLOGY/ONCOLOGY CONSULTATION NOTE  Date of Service: 08/09/2020  Patient Care Team: Girtha Rm, NP-C as PCP - General (Family Medicine)  CHIEF COMPLAINTS/PURPOSE OF CONSULTATION:  Thrombocytosis in pregnancy  HISTORY OF PRESENTING ILLNESS:  Kathryn Velazquez is a wonderful 34 y.o. female who has been referred to [place] by Harland Dingwall NP-C for evaluation and management of thrombocytosis in pregnancy. The pt reports that she is doing well overall.   The pt reports that she is seven weeks pregnant with her second child. Pt had a healthy delivery via C-section with her first child 13 years ago.    She has a long history of iron deficiency and related anemia. Her menstrual cycles last 5-6 days and are not especially heavy. Pt has tried PO Iron in the past, but had difficulty taking it consistently. She has never required a blood transfusion or any IV Iron infusions. Pt has been able to take 65 mg of iron once a day for the last two weeks. Pt denies any abdominal discomfort from PO Iron. She is also currently taking a prenatal vitamin.   She has no family history of blood disorders. Pt has no history of Polycystic ovary syndrome, Hydradenitis Suppurativa or recent infections. She has no known medication allergies. Pt has no history of blood clots.   Pt has some general fatigue, but denies any nausea or ice cravings.   Most recent lab results (07/27/2020) of CBC w/diff and CMP is as follows: all values are WNL except for WBC at 12.2, Hgb at 10.3, MCV at 71, MCH at 21.3, MCHC at 30.1, RDW at 16.4, PLT at 550K, Neutro Abs at 8.4K, Creatinine at 1.05, Albumin/Globulin Ratio at 1.1. 07/27/2020 Iron Panel is as follows: TIBC at 374, UIBC at 354, Iron at 20, Iron Sat at 5, Ferritin at 20 07/27/2020 Vitamin B12 at 742 07/27/2020 Folate at 4.2  On review of systems, pt reports fatigue and denies ice cravings, nausea joint pain, fevers, chills, night sweats, abdominal pain and any other  symptoms.   On PMHx the pt reports C-section, IDA, Thrombocytosis. On Social Hx the pt reports she stopped smoking two months ago.  MEDICAL HISTORY:  Past Medical History:  Diagnosis Date  . Anemia 11/10/2018  . Anxiety and depression   . Medication management 05/28/2020  . Thrombocytosis ([place]) 11/10/2018    SURGICAL HISTORY: Past Surgical History:  Procedure Laterality Date  . CESAREAN SECTION      SOCIAL HISTORY: Social History   Socioeconomic History  . Marital status: Single    Spouse name: Not on file  . Number of children: Not on file  . Years of education: Not on file  . Highest education level: Not on file  Occupational History  . Not on file  Tobacco Use  . Smoking status: Current Some Day Smoker    Types: Cigarettes  . Smokeless tobacco: Never Used  . Tobacco comment: twice a month  Vaping Use  . Vaping Use: Never used  Substance and Sexual Activity  . Alcohol use: Yes    Comment: rare  . Drug use: No  . Sexual activity: Not Currently    Birth control/protection: None  Other Topics Concern  . Not on file  Social History Narrative  . Not on file   Social Determinants of Health   Financial Resource Strain:   . Difficulty of Paying Living Expenses: Not on file  Food Insecurity:   . Worried About Charity fundraiser in the Last Year:  Not on file  . Ran Out of Food in the Last Year: Not on file  Transportation Needs:   . Lack of Transportation (Medical): Not on file  . Lack of Transportation (Non-Medical): Not on file  Physical Activity:   . Days of Exercise per Week: Not on file  . Minutes of Exercise per Session: Not on file  Stress:   . Feeling of Stress : Not on file  Social Connections:   . Frequency of Communication with Friends and Family: Not on file  . Frequency of Social Gatherings with Friends and Family: Not on file  . Attends Religious Services: Not on file  . Active Member of Clubs or Organizations: Not on file  . Attends Theatre manager Meetings: Not on file  . Marital Status: Not on file  Intimate Partner Violence:   . Fear of Current or Ex-Partner: Not on file  . Emotionally Abused: Not on file  . Physically Abused: Not on file  . Sexually Abused: Not on file    FAMILY HISTORY: Family History  Problem Relation Age of Onset  . Aneurysm Mother   . Alcohol abuse Mother   . Hypertension Father   . Diabetes Brother     ALLERGIES:  has No Known Allergies.  MEDICATIONS:  Current Outpatient Medications  Medication Sig Dispense Refill  . iron polysaccharides (NIFEREX) 150 MG capsule Take 1 capsule (150 mg total) by mouth 2 (two) times daily. 60 capsule 6   No current facility-administered medications for this visit.    REVIEW OF SYSTEMS:    10 Point review of Systems was done is negative except as noted above.  PHYSICAL EXAMINATION: ECOG PERFORMANCE STATUS: 0 - Asymptomatic  . Vitals:   08/09/20 1335  BP: 140/86  Pulse: 91  Resp: 18  Temp: 97.6 F (36.4 C)  SpO2: 100%   Filed Weights   08/09/20 1335  Weight: 259 lb 11.2 oz (117.8 kg)   .Body mass index is 49.07 kg/m.  GENERAL:alert, in no acute distress and comfortable SKIN: no acute rashes, no significant lesions EYES: conjunctiva are pink and non-injected, sclera anicteric OROPHARYNX: MMM, no exudates, no oropharyngeal erythema or ulceration NECK: supple, no JVD LYMPH:  no palpable lymphadenopathy in the cervical, axillary or inguinal regions LUNGS: clear to auscultation b/l with normal respiratory effort HEART: regular rate & rhythm ABDOMEN:  normoactive bowel sounds , non tender, not distended. Extremity: no pedal edema PSYCH: alert & oriented x 3 with fluent speech NEURO: no focal motor/sensory deficits  LABORATORY DATA:  I have reviewed the data as listed  . CBC Latest Ref Rng & Units 08/09/2020 07/27/2020 03/22/2020  WBC 4.0 - 10.5 K/uL 11.6(H) 12.2(H) 10.4  Hemoglobin 12.0 - 15.0 g/dL 10.4(L) 10.3(L) 10.8(L)   Hematocrit 36 - 46 % 33.1(L) 34.2 33.6(L)  Platelets 150 - 400 K/uL 515(H) 550(H) 539(H)    . CMP Latest Ref Rng & Units 08/09/2020 07/27/2020 03/22/2020  Glucose 70 - 99 mg/dL 94 91 87  BUN 6 - 20 mg/dL 11 14 11   Creatinine 0.44 - 1.00 mg/dL 0.73 1.05(H) 0.79  Sodium 135 - 145 mmol/L 134(L) 139 138  Potassium 3.5 - 5.1 mmol/L 3.8 4.4 4.6  Chloride 98 - 111 mmol/L 106 104 103  CO2 22 - 32 mmol/L 25 20 20   Calcium 8.9 - 10.3 mg/dL 9.2 9.8 9.6  Total Protein 6.5 - 8.1 g/dL 7.8 7.5 7.3  Total Bilirubin 0.3 - 1.2 mg/dL <0.2(L) <0.2 0.4  Alkaline Phos 38 -  126 U/L 99 107 111  AST 15 - 41 U/L 10(L) 16 17  ALT 0 - 44 U/L 13 20 12     Component     Latest Ref Rng & Units 11/09/2018 12/12/2019 03/22/2020 07/27/2020  Platelets     150 - 450 x10E3/uL 565 (H) 536 (H) 539 (H) 550 (H)    RADIOGRAPHIC STUDIES: I have personally reviewed the radiological images as listed and agreed with the findings in the report. No results found.  ASSESSMENT & PLAN:   34 yo at [redacted] weeks gestation with   1) Chronic thrombocytosis ? Reactive related to IDA or smoking. 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 PLAN: -Discussed patient's most recent labs from 07/27/2020, all values are WNL except for WBC at 12.2, Hgb at 10.3, MCV at 71, MCH at 21.3, MCHC at 30.1, RDW at 16.4, PLT at 550K, Neutro Abs at 8.4K, Creatinine at 1.05, Albumin/Globulin Ratio at 1.1. -Discussed 07/27/2020 TIBC at 374, UIBC at 354, Iron at 20, Iron Sat at 5, Ferritin at 20, Folate at 4.2,  Vitamin B12 at 742.  -Advised pt that thrombocytosis could be caused by medications, iron deficiency, cancers, or inflammation from autoimmune conditions.  -Advised pt that it is less likely that her thrombocytosis is caused by a primary bone marrow disorder due to the stability of her PLT. -Advised pt that it is unlikely that cancer is the cause of her thrombocytosis, as it would have become more symptomatic over the years. -Advised pt that the most probable cause  for her anemia and thrombocytosis is iron deficiency, which we know she has. -Advised pt that additional labwork to r/o more concerning etiologies is not strongly indicated, but not unreasonable - pt would prefer this.   -Advised pt that smoking causes inflammation. Her smoking cessation should help reduce PLT count as well.  -Advised pt that we try to avoid IV Iron in pregnancy if PO Iron is absorbed and is tolerated well.  -Advised pt that if Ferritin is still low in the latter half of the 2nd trimester we would consider IV Iron.  -Recommend pt take PO Iron with meals, twice per day. Advised pt that she must take it judiciously.  -Rx 150 mg Iron Polysaccharide  -Will get labs today -Will see back in 3 months with labs    FOLLOW UP: Labs today RTC with Dr Irene Limbo with labs in 3 months   All of the patients questions were answered with apparent satisfaction. The patient knows to call the clinic with any problems, questions or concerns.  I spent 35 mins counseling the patient face to face. The total time spent in the appointment was 45 minutes and more than 50% was on counseling and direct patient cares.    Sullivan Lone MD [place] AAHIVMS [facility] Hematology/Oncology Physician [facility]  (Office):       [phone] (Work cell):  [phone] (Fax):           [phone]  08/09/2020 4:57 PM  I, Yevette Edwards, am acting as a scribe for Dr. Sullivan Lone.   .I have reviewed the above documentation for accuracy and completeness, and I agree with the above. Brunetta Genera MD

## 2020-08-09 NOTE — Telephone Encounter (Signed)
Scheduled per 9/23 los. Printed calendar for pt. Pt is aware of appt time and date

## 2020-08-10 ENCOUNTER — Other Ambulatory Visit (HOSPITAL_COMMUNITY): Payer: Self-pay | Admitting: Obstetrics and Gynecology

## 2020-08-16 ENCOUNTER — Other Ambulatory Visit: Payer: BC Managed Care – PPO

## 2020-08-16 ENCOUNTER — Encounter: Payer: BC Managed Care – PPO | Admitting: Hematology and Oncology

## 2020-08-17 ENCOUNTER — Other Ambulatory Visit: Payer: Self-pay

## 2020-08-17 ENCOUNTER — Ambulatory Visit
Admission: RE | Admit: 2020-08-17 | Discharge: 2020-08-17 | Disposition: A | Discharge status: Home / Self Care | Payer: BC Managed Care – PPO | Source: Ambulatory Visit | Attending: Obstetrics and Gynecology | Admitting: Obstetrics and Gynecology

## 2020-08-17 DIAGNOSIS — Z3200 Encounter for pregnancy test, result unknown: Secondary | ICD-10-CM | POA: Diagnosis present

## 2020-08-17 DIAGNOSIS — N926 Irregular menstruation, unspecified: Secondary | ICD-10-CM | POA: Insufficient documentation

## 2020-08-23 LAB — JAK2 (INCLUDING V617F AND EXON 12), MPL,& CALR-NEXT GEN SEQ

## 2020-11-06 ENCOUNTER — Other Ambulatory Visit: Payer: Self-pay | Admitting: *Deleted

## 2020-11-06 DIAGNOSIS — D75839 Thrombocytosis, unspecified: Secondary | ICD-10-CM

## 2020-11-08 ENCOUNTER — Inpatient Hospital Stay: Payer: BC Managed Care – PPO | Admitting: Hematology

## 2020-11-08 ENCOUNTER — Inpatient Hospital Stay: Payer: BC Managed Care – PPO | Attending: Family Medicine

## 2020-11-08 NOTE — Progress Notes (Incomplete)
HEMATOLOGY/ONCOLOGY CONSULTATION NOTE  Date of Service: 11/08/2020  Patient Care Team: Avanell Shackleton, NP-C as PCP - General (Family Medicine)  CHIEF COMPLAINTS/PURPOSE OF CONSULTATION:  Thrombocytosis in pregnancy  HISTORY OF PRESENTING ILLNESS:  Kathryn Velazquez is a wonderful 34 y.o. female who has been referred to [place] by Hetty Blend NP-C for evaluation and management of thrombocytosis in pregnancy. The pt reports that she is doing well overall.   The pt reports that she is seven weeks pregnant with her second child. Pt had a healthy delivery via C-section with her first child 13 years ago.    She has a long history of iron deficiency and related anemia. Her menstrual cycles last 5-6 days and are not especially heavy. Pt has tried PO Iron in the past, but had difficulty taking it consistently. She has never required a blood transfusion or any IV Iron infusions. Pt has been able to take 65 mg of iron once a day for the last two weeks. Pt denies any abdominal discomfort from PO Iron. She is also currently taking a prenatal vitamin.   She has no family history of blood disorders. Pt has no history of Polycystic ovary syndrome, Hydradenitis Suppurativa or recent infections. She has no known medication allergies. Pt has no history of blood clots.   Pt has some general fatigue, but denies any nausea or ice cravings.   Most recent lab results (07/27/2020) of CBC w/diff and CMP is as follows: all values are WNL except for WBC at 12.2, Hgb at 10.3, MCV at 71, MCH at 21.3, MCHC at 30.1, RDW at 16.4, PLT at 550K, Neutro Abs at 8.4K, Creatinine at 1.05, Albumin/Globulin Ratio at 1.1. 07/27/2020 Iron Panel is as follows: TIBC at 374, UIBC at 354, Iron at 20, Iron Sat at 5, Ferritin at 20 07/27/2020 Vitamin B12 at 742 07/27/2020 Folate at 4.2  On review of systems, pt reports fatigue and denies ice cravings, nausea joint pain, fevers, chills, night sweats, abdominal pain and any other  symptoms.   On PMHx the pt reports C-section, IDA, Thrombocytosis. On Social Hx the pt reports she stopped smoking two months ago.  INTERVAL HISTORY: Kathryn Velazquez is a wonderful 34 y.o. female who is here for evaluation and management of thrombocytosis in pregnancy. The patient's last visit with [place] was on 08/09/2020. The pt reports that she is doing well overall.  The pt reports ***  Of note since the patient's last visit, pt has had *** completed on *** with results revealing ***.  Lab results today (11/08/20) of CBC w/diff and CMP is as follows: all values are WNL except for ***. 11/08/2020 Ferritin at *** 11/08/2020 Iron Panel is as follows: ***  On review of systems, pt reports *** and denies ***and any other symptoms.   A&P: -Discussed pt labwork today, 11/08/20; *** -***  MEDICAL HISTORY:  Past Medical History:  Diagnosis Date  . Anemia 11/10/2018  . Anxiety and depression   . Medication management 05/28/2020  . Thrombocytosis ([facility]) 11/10/2018    SURGICAL HISTORY: Past Surgical History:  Procedure Laterality Date  . CESAREAN SECTION      SOCIAL HISTORY: Social History   Socioeconomic History  . Marital status: Unknown    Spouse name: Not on file  . Number of children: Not on file  . Years of education: Not on file  . Highest education level: Not on file  Occupational History  . Not on file  Tobacco Use  . Smoking status:  Current Some Day Smoker    Types: Cigarettes  . Smokeless tobacco: Never Used  . Tobacco comment: twice a month  Vaping Use  . Vaping Use: Never used  Substance and Sexual Activity  . Alcohol use: Yes    Comment: rare  . Drug use: No  . Sexual activity: Not Currently    Birth control/protection: None  Other Topics Concern  . Not on file  Social History Narrative  . Not on file   Social Determinants of Health   Financial Resource Strain: Not on file  Food Insecurity: Not on file  Transportation Needs: Not on file   Physical Activity: Not on file  Stress: Not on file  Social Connections: Not on file  Intimate Partner Violence: Not on file    FAMILY HISTORY: Family History  Problem Relation Age of Onset  . Aneurysm Mother   . Alcohol abuse Mother   . Hypertension Father   . Diabetes Brother     ALLERGIES:  has No Known Allergies.  MEDICATIONS:  Current Outpatient Medications  Medication Sig Dispense Refill  . iron polysaccharides (NIFEREX) 150 MG capsule Take 1 capsule (150 mg total) by mouth 2 (two) times daily. 60 capsule 6   No current facility-administered medications for this visit.    REVIEW OF SYSTEMS:   A 10+ POINT REVIEW OF SYSTEMS WAS OBTAINED including neurology, dermatology, psychiatry, cardiac, respiratory, lymph, extremities, GI, GU, Musculoskeletal, constitutional, breasts, reproductive, HEENT.  All pertinent positives are noted in the HPI.  All others are negative.   PHYSICAL EXAMINATION: ECOG PERFORMANCE STATUS: 0 - Asymptomatic  . There were no vitals filed for this visit. There were no vitals filed for this visit. .There is no height or weight on file to calculate BMI.  *** GENERAL:alert, in no acute distress and comfortable SKIN: no acute rashes, no significant lesions EYES: conjunctiva are pink and non-injected, sclera anicteric OROPHARYNX: MMM, no exudates, no oropharyngeal erythema or ulceration NECK: supple, no JVD LYMPH:  no palpable lymphadenopathy in the cervical, axillary or inguinal regions LUNGS: clear to auscultation b/l with normal respiratory effort HEART: regular rate & rhythm ABDOMEN:  normoactive bowel sounds , non tender, not distended. No palpable hepatosplenomegaly.  Extremity: no pedal edema PSYCH: alert & oriented x 3 with fluent speech NEURO: no focal motor/sensory deficits  LABORATORY DATA:  I have reviewed the data as listed  . CBC Latest Ref Rng & Units 08/09/2020 07/27/2020 03/22/2020  WBC 4.0 - 10.5 K/uL 11.6(H) 12.2(H) 10.4   Hemoglobin 12.0 - 15.0 g/dL 10.4(L) 10.3(L) 10.8(L)  Hematocrit 36.0 - 46.0 % 33.1(L) 34.2 33.6(L)  Platelets 150 - 400 K/uL 515(H) 550(H) 539(H)    . CMP Latest Ref Rng & Units 08/09/2020 07/27/2020 03/22/2020  Glucose 70 - 99 mg/dL 94 91 87  BUN 6 - 20 mg/dL 11 14 11   Creatinine 0.44 - 1.00 mg/dL 0.73 1.05(H) 0.79  Sodium 135 - 145 mmol/L 134(L) 139 138  Potassium 3.5 - 5.1 mmol/L 3.8 4.4 4.6  Chloride 98 - 111 mmol/L 106 104 103  CO2 22 - 32 mmol/L 25 20 20   Calcium 8.9 - 10.3 mg/dL 9.2 9.8 9.6  Total Protein 6.5 - 8.1 g/dL 7.8 7.5 7.3  Total Bilirubin 0.3 - 1.2 mg/dL <0.2(L) <0.2 0.4  Alkaline Phos 38 - 126 U/L 99 107 111  AST 15 - 41 U/L 10(L) 16 17  ALT 0 - 44 U/L 13 20 12    08/09/2020 JAK2, MPL, CALR Panel Report:   Component  Latest Ref Rng & Units 11/09/2018 12/12/2019 03/22/2020 07/27/2020  Platelets     150 - 450 x10E3/uL 565 (H) 536 (H) 539 (H) 550 (H)    RADIOGRAPHIC STUDIES: I have personally reviewed the radiological images as listed and agreed with the findings in the report. No results found.  ASSESSMENT & PLAN:   34 yo at [redacted] weeks gestation with   1) Chronic thrombocytosis ? Reactive related to IDA or smoking. 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 PLAN: *** -Advised pt that the most probable cause for her anemia and thrombocytosis is iron deficiency, which we know she has. -Advised pt that smoking causes inflammation. Her smoking cessation should help reduce PLT count as well.  -Advised pt that if Ferritin is still low in the latter half of the 2nd trimester we would consider IV Iron.    FOLLOW UP: ***   The total time spent in the appt was *** minutes and more than 50% was on counseling and direct patient cares.  All of the patient's questions were answered with apparent satisfaction. The patient knows to call the clinic with any problems, questions or concerns.    Sullivan Lone MD [place] AAHIVMS [facility] Hematology/Oncology Physician [facility]  (Office):       [phone] (Work cell):  [phone] (Fax):           [phone]  11/08/2020 7:12 AM  I, Yevette Edwards, am acting as a scribe for Dr. Sullivan Lone.   {Add Barista Statement}

## 2020-12-12 ENCOUNTER — Encounter: Payer: BC Managed Care – PPO | Admitting: Family Medicine

## 2020-12-14 ENCOUNTER — Encounter: Payer: Self-pay | Admitting: Family Medicine

## 2021-01-04 ENCOUNTER — Other Ambulatory Visit: Payer: Self-pay

## 2021-01-04 ENCOUNTER — Ambulatory Visit (INDEPENDENT_AMBULATORY_CARE_PROVIDER_SITE_OTHER): Payer: BC Managed Care – PPO | Admitting: Cardiovascular Disease

## 2021-01-04 ENCOUNTER — Encounter: Payer: Self-pay | Admitting: Cardiovascular Disease

## 2021-01-04 VITALS — BP 118/64 | HR 113 | Ht 61.0 in | Wt 265.2 lb

## 2021-01-04 DIAGNOSIS — Z3A27 27 weeks gestation of pregnancy: Secondary | ICD-10-CM

## 2021-01-04 DIAGNOSIS — R002 Palpitations: Secondary | ICD-10-CM

## 2021-01-04 DIAGNOSIS — R000 Tachycardia, unspecified: Secondary | ICD-10-CM

## 2021-01-04 LAB — COMPREHENSIVE METABOLIC PANEL
ALT: 7 IU/L (ref 0–32)
AST: 9 IU/L (ref 0–40)
Albumin/Globulin Ratio: 1 — ABNORMAL LOW (ref 1.2–2.2)
Albumin: 3.5 g/dL — ABNORMAL LOW (ref 3.8–4.8)
Alkaline Phosphatase: 158 IU/L — ABNORMAL HIGH (ref 44–121)
BUN/Creatinine Ratio: 13 (ref 9–23)
BUN: 6 mg/dL (ref 6–20)
Bilirubin Total: 0.2 mg/dL (ref 0.0–1.2)
CO2: 17 mmol/L — ABNORMAL LOW (ref 20–29)
Calcium: 9.3 mg/dL (ref 8.7–10.2)
Chloride: 102 mmol/L (ref 96–106)
Creatinine, Ser: 0.47 mg/dL — ABNORMAL LOW (ref 0.57–1.00)
GFR calc Af Amer: 150 mL/min/{1.73_m2} (ref >59)
GFR calc non Af Amer: 130 mL/min/{1.73_m2} (ref >59)
Globulin, Total: 3.6 g/dL (ref 1.5–4.5)
Glucose: 81 mg/dL (ref 65–99)
Potassium: 4.2 mmol/L (ref 3.5–5.2)
Sodium: 135 mmol/L (ref 134–144)
Total Protein: 7.1 g/dL (ref 6.0–8.5)

## 2021-01-04 LAB — CBC WITH DIFFERENTIAL/PLATELET
Basophils Absolute: 0 10*3/uL (ref 0.0–0.2)
Basos: 0 %
EOS (ABSOLUTE): 0.1 10*3/uL (ref 0.0–0.4)
Eos: 1 %
Hematocrit: 32.7 % — ABNORMAL LOW (ref 34.0–46.6)
Hemoglobin: 10.4 g/dL — ABNORMAL LOW (ref 11.1–15.9)
Immature Grans (Abs): 0.3 10*3/uL — ABNORMAL HIGH (ref 0.0–0.1)
Immature Granulocytes: 2 %
Lymphocytes Absolute: 2 10*3/uL (ref 0.7–3.1)
Lymphs: 14 %
MCH: 23.3 pg — ABNORMAL LOW (ref 26.6–33.0)
MCHC: 31.8 g/dL (ref 31.5–35.7)
MCV: 73 fL — ABNORMAL LOW (ref 79–97)
Monocytes Absolute: 1 10*3/uL — ABNORMAL HIGH (ref 0.1–0.9)
Monocytes: 6 %
Neutrophils Absolute: 11.7 10*3/uL — ABNORMAL HIGH (ref 1.4–7.0)
Neutrophils: 77 %
Platelets: 485 10*3/uL — ABNORMAL HIGH (ref 150–450)
RBC: 4.46 x10E6/uL (ref 3.77–5.28)
RDW: 15.2 % (ref 11.7–15.4)
WBC: 15.1 10*3/uL — ABNORMAL HIGH (ref 3.4–10.8)

## 2021-01-04 LAB — MAGNESIUM: Magnesium: 1.6 mg/dL (ref 1.6–2.3)

## 2021-01-04 NOTE — Patient Instructions (Addendum)
Medication Instructions:  Your physician recommends that you continue on your current medications as directed. Please refer to the Current Medication list given to you today.  *If you need a refill on your cardiac medications before your next appointment, please call your pharmacy*   Lab Work: CMET/CBC/MAGNESIUM TODAY   If you have labs (blood work) drawn today and your tests are completely normal, you will receive your results only by: Marland Kitchen MyChart Message (if you have MyChart) OR . A paper copy in the mail If you have any lab test that is abnormal or we need to change your treatment, we will call you to review the results.  Testing/Procedures: Your physician has requested that you have an echocardiogram. Echocardiography is a painless test that uses sound waves to create images of your heart. It provides your doctor with information about the size and shape of your heart and how well your heart's chambers and valves are working. This procedure takes approximately one hour. There are no restrictions for this procedure. [address]  Your physician has recommended that you wear an event monitor. Event monitors are medical devices that record the heart's electrical activity. Doctors most often [place] these monitors to diagnose arrhythmias. Arrhythmias are problems with the speed or rhythm of the heartbeat. The monitor is a small, portable device. You can wear one while you do your normal daily activities. This is usually used to diagnose what is causing palpitations/syncope (passing out). 61 DAY   Follow-Up: At [facility], you and your health needs are our priority.  As part of our continuing mission to provide you with exceptional heart care, we have created designated Provider Care Teams.  These Care Teams include your primary Cardiologist (physician) and Advanced Practice Providers (APPs -  Physician Assistants and Nurse Practitioners) who all work together to  provide you with the care you need, when you need it.  We recommend signing up for the patient portal called "MyChart".  Sign up information is provided on this After Visit Summary.  MyChart is used to connect with patients for Virtual Visits (Telemedicine).  Patients are able to view lab/test results, encounter notes, upcoming appointments, etc.  Non-urgent messages can be sent to your provider as well.   To learn more about what you can do with MyChart, go to [email or web address].    Your next appointment:   6 WEEKS  The format for your next appointment:   In Person  Provider:   You may see DR [facility]  or one of the following Advanced Practice Providers on your designated Care Team:    Kerin Ransom, PA-C  [place], [place]  Coletta Memos, [place]  Other Instructions  [employer] Cardiac Event Monitor Instructions Your physician has requested you wear your cardiac event monitor for __14__ days, (1-30). [employer] may call or text to confirm a shipping address. The monitor will be sent to a land address via UPS. [employer] will not ship a monitor to a PO BOX. It typically takes 3-5 days to receive your monitor after it has been enrolled. [employer] will assist with USPS tracking if your package is delayed. The telephone number for [employer] is [phone]. Once you have received your monitor, please review the enclosed instructions. Instruction tutorials can also be viewed under help and settings on the enclosed cell phone. Your monitor has already been registered assigning a specific monitor serial # to you.  Applying the monitor Remove cell phone from case and turn it  on. The cell phone works as Dealer and needs to be within [employer] of you at all times. The cell phone will need to be charged on a daily basis. We recommend you plug the cell phone into the enclosed charger at your bedside table every night.  Monitor batteries: You will receive two monitor  batteries labelled #1 and #2. These are your recorders. Plug battery #2 onto the second connection on the enclosed charger. Keep one battery on the charger at all times. This will keep the monitor battery deactivated. It will also keep it fully charged for when you need to switch your monitor batteries. A small light will be blinking on the battery emblem when it is charging. The light on the battery emblem will remain on when the battery is fully charged.  Open package of a Monitor strip. Insert battery #1 into black hood on strip and gently squeeze monitor battery onto connection as indicated in instruction booklet. Set aside while preparing skin.  Choose location for your strip, vertical or horizontal, as indicated in the instruction booklet. Shave to remove all hair from location. There cannot be any lotions, oils, powders, or colognes on skin where monitor is to be applied. Wipe skin clean with enclosed Saline wipe. Dry skin completely.  Peel paper labeled #1 off the back of the Monitor strip exposing the adhesive. Place the monitor on the chest in the vertical or horizontal position shown in the instruction booklet. One arrow on the monitor strip must be pointing upward. Carefully remove paper labeled #2, attaching remainder of strip to your skin. Try not to create any folds or wrinkles in the strip as you apply it.  Firmly press and release the circle in the center of the monitor battery. You will hear a small beep. This is turning the monitor battery on. The heart emblem on the monitor battery will light up every 5 seconds if the monitor battery in turned on and connected to the patient securely. Do not push and hold the circle down as this turns the monitor battery off. The cell phone will locate the monitor battery. A screen will appear on the cell phone checking the connection of your monitor strip. This may read poor connection initially but change to good connection within the  next minute. Once your monitor accepts the connection you will hear a series of 3 beeps followed by a climbing crescendo of beeps. A screen will appear on the cell phone showing the two monitor strip placement options. Touch the picture that demonstrates where you applied the monitor strip.  Your monitor strip and battery are waterproof. You are able to shower, bathe, or swim with the monitor on. They just ask you do not submerge deeper than 3 feet underwater. We recommend removing the monitor if you are swimming in a lake, river, or ocean.  Your monitor battery will need to be switched to a fully charged monitor battery approximately once a week. The cell phone will alert you of an action which needs to be made.  On the cell phone, tap for details to reveal connection status, monitor battery status, and cell phone battery status. The green dots indicates your monitor is in good status. A red dot indicates there is something that needs your attention.  To record a symptom, click the circle on the monitor battery. In 30-60 seconds a list of symptoms will appear on the cell phone. Select your symptom and tap save. Your monitor will record a  sustained or significant arrhythmia regardless of you clicking the button. Some patients do not feel the heart rhythm irregularities. [employer] will notify us of any serious or critical events.  Refer to instruction booklet for instructions on switching batteries, changing strips, the Do not disturb or Pause features, or any additional questions.  Call [employer] at [phone], to confirm your monitor is transmitting and record your baseline. They will answer any questions you may have regarding the monitor instructions at that time.  Returning the monitor to [place] all equipment back into blue box. Peel off strip of paper to expose adhesive and close box securely. There is a prepaid UPS shipping label on this box. Drop in a UPS drop box,  or at a UPS facility like [employer]. You may also contact [employer] to arrange UPS to pick up monitor package at your home.  Echocardiogram An echocardiogram is a test that uses sound waves (ultrasound) to produce images of the heart. Images from an echocardiogram can provide important information about:  Heart size and shape.  The size and thickness and movement of your heart's walls.  Heart muscle function and strength.  Heart valve function or if you have stenosis. Stenosis is when the heart valves are too narrow.  If blood is flowing backward through the heart valves (regurgitation).  A tumor or infectious growth around the heart valves.  Areas of heart muscle that are not working well because of poor blood flow or injury from a heart attack.  Aneurysm detection. An aneurysm is a weak or damaged part of an artery wall. The wall bulges out from the normal force of blood pumping through the body. Tell a health care provider about:  Any allergies you have.  All medicines you are taking, including vitamins, herbs, eye drops, creams, and over-the-counter medicines.  Any blood disorders you have.  Any surgeries you have had.  Any medical conditions you have.  Whether you are pregnant or may be pregnant. What are the risks? Generally, this is a safe test. However, problems may occur, including an allergic reaction to dye (contrast) that may be used during the test. What happens before the test? No specific preparation is needed. You may eat and drink normally. What happens during the test?  You will take off your clothes from the waist up and put on a hospital gown.  Electrodes or electrocardiogram (ECG)patches may be placed on your chest. The electrodes or patches are then connected to a device that monitors your heart rate and rhythm.  You will lie down on a table for an ultrasound exam. A gel will be applied to your chest to help sound waves pass through your skin.  A  handheld device, called a transducer, will be pressed against your chest and moved over your heart. The transducer produces sound waves that travel to your heart and bounce back (or "echo" back) to the transducer. These sound waves will be captured in real-time and changed into images of your heart that can be viewed on a video monitor. The images will be recorded on a computer and reviewed by your health care provider.  You may be asked to change positions or hold your breath for a short time. This makes it easier to get different views or better views of your heart.  In some cases, you may receive contrast through an IV in one of your veins. This can improve the quality of the pictures from your heart. The procedure may vary among health care  providers and hospitals.   What can I expect after the test? You may return to your normal, everyday life, including diet, activities, and medicines, unless your health care provider tells you not to do that. Follow these instructions at home:  It is up to you to get the results of your test. Ask your health care provider, or the department that is doing the test, when your results will be ready.  Keep all follow-up visits. This is important. Summary  An echocardiogram is a test that uses sound waves (ultrasound) to produce images of the heart.  Images from an echocardiogram can provide important information about the size and shape of your heart, heart muscle function, heart valve function, and other possible heart problems.  You do not need to do anything to prepare before this test. You may eat and drink normally.  After the echocardiogram is completed, you may return to your normal, everyday life, unless your health care provider tells you not to do that. This information is not intended to replace advice given to you by your health care provider. Make sure you discuss any questions you have with your health care provider. Document Revised:  06/26/2020 Document Reviewed: 06/26/2020 Elsevier Patient Education  2021 [employer].

## 2021-01-04 NOTE — Progress Notes (Signed)
Cardiology Office Note   Date:  01/06/2021   ID:  Kathryn, Velazquez 1985-12-24, MRN [id number]  PCP:  Girtha Rm, NP-C  Cardiologist:   Skeet Latch, MD   No chief complaint on file.     History of Present Illness: Kathryn Velazquez is a 35 y.o. female G2 [redacted] weeks pregnant who is being seen today for the evaluation of tachycardia at the request of Servando Salina, MD.  She noted that her heart rate was elevated and it was also seen with her OB/GYN.  She felt her heart racing in Dec/Jan intermittently for a couple weeks.  She recalls waking up with a racing heart that lasted several minutes.  It typically occurred when napping.  She hasn't noticed it lately.  She doesn't get much formal exercise but hasn't noted it with normal ADLs.  There was no associated shortness of breath, lightheadedness or dizziness.  She notes racing with coffee since pregnancy.   She drinks one soda daily.  She notes that she has been more anxious lately but that seems to be improving.  She stopped taking anxiety meds before pregnancy.  She has no edema, orthopnea or PND.  She denies any issues with her prior pregnancy which was 13 years ago.   Past Medical History:  Diagnosis Date  . Anemia 11/10/2018  . Anxiety and depression   . 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 Medication management 05/28/2020  . Palpitations 01/06/2021  . Pregnancy 01/06/2021  . Thrombocytosis 11/10/2018    Past Surgical History:  Procedure Laterality Date  . CESAREAN SECTION       Current Outpatient Medications  Medication Sig Dispense Refill  . iron polysaccharides (NIFEREX) 150 MG capsule Take 1 capsule (150 mg total) by mouth 2 (two) times daily. 60 capsule 6   No current facility-administered medications for this visit.    Allergies:   Patient has no known allergies.    Social History:  The patient  reports that she quit smoking about 13 months ago. Her smoking use included cigarettes. She has  never used smokeless tobacco. She reports current alcohol use. She reports that she does not use drugs.   Family History:  The patient's family history includes Alcohol abuse in her mother; Aneurysm in her mother; Cerebral aneurysm in her mother; Diabetes in her brother; Hypertension in her father.    ROS:  Please see the history of present illness.   Otherwise, review of systems are positive for none.   All other systems are reviewed and negative.    PHYSICAL EXAM: VS:  BP 118/64 (BP Location: Left Arm, Patient Position: Sitting)   Pulse (!) 113   Ht 5\' 1"  (1.549 m)   Wt 265 lb 3.2 oz (120.3 kg)   SpO2 99%   BMI 50.11 kg/m  , BMI Body mass index is 50.11 kg/m. GENERAL:  Well appearing HEENT:  Pupils equal round and reactive, fundi not visualized, oral mucosa unremarkable NECK:  No jugular venous distention, waveform within normal limits, carotid upstroke brisk and symmetric, no bruits LUNGS:  Clear to auscultation bilaterally HEART:  RRR.  PMI not displaced or sustained,S1 and S2 within normal limits, no S3, no S4, no clicks, no rubs, no murmurs ABD:  Gravid uterus.  Positive bowel sounds normal in frequency in pitch, no bruits, no rebound, no guarding, no midline pulsatile mass, no hepatomegaly, no splenomegaly EXT:  2 plus pulses throughout, no edema, no cyanosis no clubbing SKIN:  No rashes no nodules  NEURO:  Cranial nerves II through XII grossly intact, motor grossly intact throughout PSYCH:  Cognitively intact, oriented to person place and time   EKG:  EKG is ordered today. The ekg ordered today demonstrates sinus tachycardia.  Rate 113 bpm.   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  The fact that it occurs after sleeping suggest that sleep apnea may be contributing.  This could be occurring in the setting of more weight gain with pregnancy and baseline obesity.  If it continues after delivery we will get a sleep study.  For now, check a CMP, magnesium, and CBC.  She also has chronic anemia which may be worsened and could be contributing.  Overall she was reassured that this is likely to resolve after delivery.  We will get a 14-day ambulatory monitor to assess for any serious arrhythmias and to associate the timing with sleep.  No pharmacological intervention at this time.   Current medicines are reviewed at length with the patient today.  The patient does not have concerns regarding medicines.  The following changes have been made:  no change  Labs/ tests ordered today include:   Orders Placed This Encounter  Procedures  . CBC with Differential/Platelet  . Magnesium  . Comprehensive metabolic panel  . EKG 12-Lead  . ECHOCARDIOGRAM COMPLETE     Disposition:   FU with Jonus Coble C. Oval Linsey, MD, [facility] in 6 weeks     Signed, [place] Oval Linsey, MD, [facility]  01/06/2021 11:43 AM    [place]

## 2021-01-06 ENCOUNTER — Encounter: Payer: Self-pay | Admitting: Cardiovascular Disease

## 2021-01-06 DIAGNOSIS — Z3490 Encounter for supervision of normal pregnancy, unspecified, unspecified trimester: Secondary | ICD-10-CM

## 2021-01-06 DIAGNOSIS — I4711 Inappropriate sinus tachycardia, so stated: Secondary | ICD-10-CM

## 2021-01-06 DIAGNOSIS — R002 Palpitations: Secondary | ICD-10-CM

## 2021-01-06 DIAGNOSIS — R000 Tachycardia, unspecified: Secondary | ICD-10-CM

## 2021-01-06 HISTORY — DX: Palpitations: R00.2

## 2021-01-06 HISTORY — DX: Inappropriate sinus tachycardia, so stated: I47.11

## 2021-01-06 HISTORY — DX: Encounter for supervision of normal pregnancy, unspecified, unspecified trimester: Z34.90

## 2021-01-06 HISTORY — DX: Tachycardia, unspecified: R00.0

## 2021-01-29 ENCOUNTER — Other Ambulatory Visit: Payer: Self-pay

## 2021-01-29 ENCOUNTER — Ambulatory Visit (HOSPITAL_COMMUNITY): Payer: BC Managed Care – PPO | Attending: Cardiovascular Disease

## 2021-01-29 DIAGNOSIS — R002 Palpitations: Secondary | ICD-10-CM | POA: Diagnosis not present

## 2021-01-29 LAB — ECHOCARDIOGRAM COMPLETE
Area-P 1/2: 5.75 cm2
S' Lateral: 1.95 cm

## 2021-02-12 ENCOUNTER — Other Ambulatory Visit: Payer: Self-pay | Admitting: Obstetrics and Gynecology

## 2021-02-12 DIAGNOSIS — O26843 Uterine size-date discrepancy, third trimester: Secondary | ICD-10-CM

## 2021-02-12 DIAGNOSIS — O24410 Gestational diabetes mellitus in pregnancy, diet controlled: Secondary | ICD-10-CM

## 2021-02-12 DIAGNOSIS — Z363 Encounter for antenatal screening for malformations: Secondary | ICD-10-CM

## 2021-02-15 ENCOUNTER — Encounter (HOSPITAL_COMMUNITY): Payer: Self-pay | Admitting: Obstetrics and Gynecology

## 2021-02-15 ENCOUNTER — Inpatient Hospital Stay (HOSPITAL_COMMUNITY)
Admission: AD | Admit: 2021-02-15 | Discharge: 2021-02-15 | Disposition: A | Discharge status: Home / Self Care | Payer: BC Managed Care – PPO | Attending: Obstetrics and Gynecology | Admitting: Obstetrics and Gynecology

## 2021-02-15 ENCOUNTER — Inpatient Hospital Stay (HOSPITAL_BASED_OUTPATIENT_CLINIC_OR_DEPARTMENT_OTHER): Payer: BC Managed Care – PPO

## 2021-02-15 ENCOUNTER — Other Ambulatory Visit: Payer: Self-pay

## 2021-02-15 DIAGNOSIS — O24415 Gestational diabetes mellitus in pregnancy, controlled by oral hypoglycemic drugs: Secondary | ICD-10-CM | POA: Diagnosis not present

## 2021-02-15 DIAGNOSIS — Z3689 Encounter for other specified antenatal screening: Secondary | ICD-10-CM | POA: Diagnosis present

## 2021-02-15 DIAGNOSIS — Z87891 Personal history of nicotine dependence: Secondary | ICD-10-CM | POA: Insufficient documentation

## 2021-02-15 DIAGNOSIS — O403XX0 Polyhydramnios, third trimester, not applicable or unspecified: Secondary | ICD-10-CM | POA: Diagnosis not present

## 2021-02-15 DIAGNOSIS — O34219 Maternal care for unspecified type scar from previous cesarean delivery: Secondary | ICD-10-CM | POA: Insufficient documentation

## 2021-02-15 DIAGNOSIS — Z833 Family history of diabetes mellitus: Secondary | ICD-10-CM | POA: Insufficient documentation

## 2021-02-15 DIAGNOSIS — O24410 Gestational diabetes mellitus in pregnancy, diet controlled: Secondary | ICD-10-CM

## 2021-02-15 DIAGNOSIS — Z3A33 33 weeks gestation of pregnancy: Secondary | ICD-10-CM | POA: Diagnosis not present

## 2021-02-15 LAB — URINALYSIS, ROUTINE W REFLEX MICROSCOPIC
Bilirubin Urine: NEGATIVE
Glucose, UA: NEGATIVE mg/dL
Hgb urine dipstick: NEGATIVE
Ketones, ur: 80 mg/dL — AB
Nitrite: NEGATIVE
Protein, ur: NEGATIVE mg/dL
Specific Gravity, Urine: 1.021 (ref 1.005–1.030)
pH: 5 (ref 5.0–8.0)

## 2021-02-15 NOTE — MAU Provider Note (Signed)
History     Chief Complaint  Patient presents with  . Monitoring  35 yo G2P1001 BF with Class A2 GDM @ 33 2/7 wk sent from the office due to prolonged NR NST. (+) FM notes some contractions. Pt is on metformin. Previous C/S   OB History    Gravida  2   Para  1   Term  1   Preterm      AB      Living  1     SAB      IAB      Ectopic      Multiple      Live Births  1           Past Medical History:  Diagnosis Date  . Anemia 11/10/2018  . Anxiety and depression   . 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 Medication management 05/28/2020  . Palpitations 01/06/2021  . Pregnancy 01/06/2021  . Thrombocytosis 11/10/2018    Past Surgical History:  Procedure Laterality Date  . CESAREAN SECTION      Family History  Problem Relation Age of Onset  . Aneurysm Mother   . Alcohol abuse Mother   . Cerebral aneurysm Mother   . Hypertension Father   . Diabetes Brother     Social History   Tobacco Use  . Smoking status: Former Smoker    Types: Cigarettes    Quit date: 2021    Years since quitting: 1.2  . Smokeless tobacco: Never Used  . Tobacco comment: twice a month  Vaping Use  . Vaping Use: Never used  Substance Use Topics  . Alcohol use: Yes    Comment: rare  . Drug use: No    Allergies: No Known Allergies  Medications Prior to Admission  Medication Sig Dispense Refill Last Dose  . iron polysaccharides (NIFEREX) 150 MG capsule Take 1 capsule (150 mg total) by mouth 2 (two) times daily. 60 capsule 6 02/14/2021 at Unknown time  . metFORMIN (GLUMETZA) 1000 MG (MOD) 24 hr tablet Take 2,000 mg by mouth daily with breakfast.   02/14/2021 at Unknown time  . Prenatal Vit-Fe Fumarate-FA (PRENATAL MULTIVITAMIN) TABS tablet Take 1 tablet by mouth daily at 12 noon.   02/14/2021 at Unknown time     Physical Exam   Blood pressure 124/76, pulse (!) 110, temperature 97.9 F (36.6 C), temperature source Oral, resp. rate 16, height 5\' 2"  (1.575 m), weight  124.6 kg, last menstrual period 06/20/2020, SpO2 100 %.  Exam done in office ED Course   IMP: NR NST Class A2 GDM Previous C/S IUP @ 33 2/7 wk P) NST here. BPP if indicated MDM  NST done: baseline 145 (+) accel 15 x 15. fair Variability   irreg contractions  BPP ordered Marvene Staff, MD 6:57 PM 02/15/2021   Addendum Preliminary report BPP 8/8 Polyhydramnios Pt advised  instructed on daily kick ct, need for cont  Fetal surveillance D/c home

## 2021-02-15 NOTE — MAU Note (Signed)
Kathryn Velazquez is a 35 y.o. at [redacted]w[redacted]d here in MAU reporting: was sent over from the office for nonreactive NST. Pt reports good fetal movement. No pain, bleeding, or discharge.  Onset of complaint: today  Pain score: 0/10  Vitals:   02/15/21 1729  BP: (!) 154/77  Pulse: (!) 104  Resp: 16  Temp: 97.9 F (36.6 C)  SpO2: 100%     FHT: 148  Lab orders placed from triage: none

## 2021-02-15 NOTE — Discharge Instructions (Signed)
Preterm labor precautions. Daily kick ct

## 2021-02-16 DIAGNOSIS — Z3A33 33 weeks gestation of pregnancy: Secondary | ICD-10-CM | POA: Diagnosis not present

## 2021-02-16 DIAGNOSIS — O288 Other abnormal findings on antenatal screening of mother: Secondary | ICD-10-CM

## 2021-02-19 ENCOUNTER — Encounter: Payer: Self-pay | Admitting: Family Medicine

## 2021-02-19 ENCOUNTER — Other Ambulatory Visit: Payer: Self-pay

## 2021-02-19 ENCOUNTER — Telehealth: Payer: BC Managed Care – PPO | Admitting: Family Medicine

## 2021-02-19 VITALS — Temp 97.0°F | Wt 274.0 lb

## 2021-02-19 DIAGNOSIS — J069 Acute upper respiratory infection, unspecified: Secondary | ICD-10-CM

## 2021-02-19 MED ORDER — ALBUTEROL SULFATE HFA 108 (90 BASE) MCG/ACT IN AERS: 2.0000 | INHALATION_SPRAY | Freq: Four times a day (QID) | RESPIRATORY_TRACT | 0 refills | Status: DC | PRN

## 2021-02-19 NOTE — Progress Notes (Signed)
   Subjective:    Patient ID: Kathryn Velazquez, female    DOB: 03-06-1986, 35 y.o.   MRN: [id number]  HPI Documentation for virtual audio and video telecommunications through [place] encounter: The patient was located at home. 2 patient identifiers used.  The provider was located in the office. The patient did consent to this visit and is aware of possible charges through their insurance for this visit. The other persons participating in this telemedicine service were none. Time spent on call was 5 minutes and in review of previous records >20 minutes total for counseling and coordination of care. This virtual service is not related to other E/M service within previous 7 days. She states that on Saturday she developed chest congestion, coughing with wheezing but no fever, chills, sore throat, earache.  No history of previous trouble with allergies or asthma.  She did get tested on Sunday for Covid and was negative.  Review of Systems     Objective:   Physical Exam Alert and in no distress.  Normal breathing pattern noted but slight hoarse voice.       Assessment & Plan:  Viral URI with cough - Plan: albuterol (VENTOLIN HFA) 108 (90 Base) MCG/ACT inhaler Recommend she use this as needed.  She can also use Tylenol for aches and pains.  If she is still having difficulty in the first week, she is to call back.  She was comfortable with that.

## 2021-02-20 ENCOUNTER — Ambulatory Visit: Payer: Self-pay | Admitting: Cardiovascular Disease

## 2021-02-20 ENCOUNTER — Telehealth: Payer: Self-pay | Admitting: *Deleted

## 2021-02-20 ENCOUNTER — Other Ambulatory Visit: Payer: Self-pay | Admitting: *Deleted

## 2021-02-20 DIAGNOSIS — R002 Palpitations: Secondary | ICD-10-CM

## 2021-02-20 NOTE — Telephone Encounter (Signed)
Patient called to cancel appointment today secondary to being sick Covid test Sunday neg but concern tested too soon and planning on testing again  Will reach out to patient to get rescheduled after monitor results back

## 2021-02-27 ENCOUNTER — Telehealth: Payer: Self-pay

## 2021-02-27 ENCOUNTER — Other Ambulatory Visit: Payer: Self-pay

## 2021-02-27 ENCOUNTER — Other Ambulatory Visit: Payer: Self-pay | Admitting: Obstetrics and Gynecology

## 2021-02-27 ENCOUNTER — Ambulatory Visit: Payer: BC Managed Care – PPO | Admitting: *Deleted

## 2021-02-27 ENCOUNTER — Ambulatory Visit: Payer: BC Managed Care – PPO | Attending: Obstetrics and Gynecology | Admitting: *Deleted

## 2021-02-27 ENCOUNTER — Encounter: Payer: Self-pay | Admitting: *Deleted

## 2021-02-27 ENCOUNTER — Ambulatory Visit (HOSPITAL_BASED_OUTPATIENT_CLINIC_OR_DEPARTMENT_OTHER): Payer: BC Managed Care – PPO

## 2021-02-27 VITALS — BP 131/75 | HR 104

## 2021-02-27 DIAGNOSIS — O26843 Uterine size-date discrepancy, third trimester: Secondary | ICD-10-CM | POA: Insufficient documentation

## 2021-02-27 DIAGNOSIS — O99213 Obesity complicating pregnancy, third trimester: Secondary | ICD-10-CM | POA: Diagnosis not present

## 2021-02-27 DIAGNOSIS — O24414 Gestational diabetes mellitus in pregnancy, insulin controlled: Secondary | ICD-10-CM

## 2021-02-27 DIAGNOSIS — O409XX0 Polyhydramnios, unspecified trimester, not applicable or unspecified: Secondary | ICD-10-CM | POA: Diagnosis not present

## 2021-02-27 DIAGNOSIS — Z3A35 35 weeks gestation of pregnancy: Secondary | ICD-10-CM | POA: Insufficient documentation

## 2021-02-27 DIAGNOSIS — O4703 False labor before 37 completed weeks of gestation, third trimester: Secondary | ICD-10-CM | POA: Insufficient documentation

## 2021-02-27 DIAGNOSIS — O24415 Gestational diabetes mellitus in pregnancy, controlled by oral hypoglycemic drugs: Secondary | ICD-10-CM | POA: Insufficient documentation

## 2021-02-27 DIAGNOSIS — O403XX0 Polyhydramnios, third trimester, not applicable or unspecified: Secondary | ICD-10-CM | POA: Insufficient documentation

## 2021-02-27 DIAGNOSIS — O24410 Gestational diabetes mellitus in pregnancy, diet controlled: Secondary | ICD-10-CM | POA: Diagnosis not present

## 2021-02-27 DIAGNOSIS — Z363 Encounter for antenatal screening for malformations: Secondary | ICD-10-CM | POA: Insufficient documentation

## 2021-02-27 DIAGNOSIS — E669 Obesity, unspecified: Secondary | ICD-10-CM | POA: Diagnosis not present

## 2021-02-27 NOTE — Progress Notes (Signed)
MFM Consult Note  Kathryn Velazquez was seen for an exam today due to gestational diabetes that is currently treated with Metformin and maternal obesity with a BMI of 46.8.  The patient reports that her fasting fingerstick values remain elevated in the 100s to 110s range.  Her 2-hour postprandial fingerstick values have been within normal limits.  She denies any other problems in her current pregnancy and reports feeling fetal movements throughout the day.  She was informed that the fetal growth measures large for her gestational age (EFW 6 pounds 9 ounces, 88th percentile with the [facility] greater than the 99th percentile). Polyhydramnios with a total AFI of 38.6 cm is noted today.  A biophysical profile performed today was 6 out of 8.  She received a -2 for fetal breathing movements that did not meet criteria.  She subsequently had a reactive nonstress test, making her total biophysical profile score 8 out of 10.  The views of the fetal anatomy were limited today due to her advanced gestational age.  The implications and management of diabetes in pregnancy was discussed in detail with the patient. She was advised that our goals for her fingerstick values are fasting values of 90-95 or less and two-hour postprandials of 120 or less.   The patient was advised that getting her fingerstick values as close to these goals as possible would provide her with the most optimal obstetrical outcome.  Due to the large for gestational age fetus with polyhydramnios and as it appears that her gestational diabetes is not under great control, I would recommend that she be delivered at between 12 to 29 weeks.  She should continue weekly fetal testing in your office until delivery.    No further exams were scheduled in our office.  A total of 35 minutes was spent counseling and coordinating the care for this patient.  Greater than 50% of the time was spent in direct face-to-face contact.

## 2021-02-27 NOTE — Procedures (Signed)
Kathryn Velazquez [birth year] [redacted]w[redacted]d  Fetus A Non-Stress Test Interpretation for 02/27/21  Indication: Unsatisfactory BPP  Fetal Heart Rate A Mode: External Baseline Rate (A): 140 bpm Variability: Moderate Accelerations: 15 x 15 Decelerations: None Multiple birth?: No  Uterine Activity Mode: Palpation,Toco Contraction Frequency (min): 1-4 Contraction Duration (sec): 40-140 Contraction Quality: Mild Resting Tone Palpated: Relaxed Resting Time: Adequate  Interpretation (Fetal Testing) Nonstress Test Interpretation: Reactive Overall Impression: Reassuring for gestational age Comments: Dr. Annamaria Boots reviewed tracing.

## 2021-02-27 NOTE — Telephone Encounter (Signed)
Dr. Garwin Brothers office called to let us know that if the patient needs a NST to add it on with [place].  If an NST is not needed the patient does not need to go to Dr. Garwin Brothers office.  Kathryn Velazquez. Aware that the patient needs to be told that there is not a need to go to Dr. Harvie Bridge office today.

## 2021-03-04 ENCOUNTER — Encounter (HOSPITAL_COMMUNITY)
Admission: AD | Disposition: A | Discharge status: Home / Self Care | Payer: Self-pay | Source: Home / Self Care | Attending: Obstetrics and Gynecology

## 2021-03-04 ENCOUNTER — Inpatient Hospital Stay (HOSPITAL_COMMUNITY): Payer: BC Managed Care – PPO | Admitting: Anesthesiology

## 2021-03-04 ENCOUNTER — Other Ambulatory Visit: Payer: Self-pay

## 2021-03-04 ENCOUNTER — Inpatient Hospital Stay (HOSPITAL_COMMUNITY)
Admission: AD | Admit: 2021-03-04 | Discharge: 2021-03-07 | DRG: 788 | Disposition: A | Discharge status: Home / Self Care | Payer: BC Managed Care – PPO | Attending: Obstetrics and Gynecology | Admitting: Obstetrics and Gynecology

## 2021-03-04 ENCOUNTER — Encounter (HOSPITAL_COMMUNITY): Payer: Self-pay | Admitting: Obstetrics and Gynecology

## 2021-03-04 DIAGNOSIS — O42919 Preterm premature rupture of membranes, unspecified as to length of time between rupture and onset of labor, unspecified trimester: Secondary | ICD-10-CM | POA: Diagnosis present

## 2021-03-04 DIAGNOSIS — O99214 Obesity complicating childbirth: Secondary | ICD-10-CM | POA: Diagnosis present

## 2021-03-04 DIAGNOSIS — Z87891 Personal history of nicotine dependence: Secondary | ICD-10-CM | POA: Diagnosis not present

## 2021-03-04 DIAGNOSIS — O9081 Anemia of the puerperium: Secondary | ICD-10-CM | POA: Diagnosis not present

## 2021-03-04 DIAGNOSIS — O34219 Maternal care for unspecified type scar from previous cesarean delivery: Secondary | ICD-10-CM

## 2021-03-04 DIAGNOSIS — Z20822 Contact with and (suspected) exposure to covid-19: Secondary | ICD-10-CM | POA: Diagnosis present

## 2021-03-04 DIAGNOSIS — O24410 Gestational diabetes mellitus in pregnancy, diet controlled: Secondary | ICD-10-CM

## 2021-03-04 DIAGNOSIS — O403XX0 Polyhydramnios, third trimester, not applicable or unspecified: Secondary | ICD-10-CM | POA: Diagnosis present

## 2021-03-04 DIAGNOSIS — O42913 Preterm premature rupture of membranes, unspecified as to length of time between rupture and onset of labor, third trimester: Secondary | ICD-10-CM | POA: Diagnosis present

## 2021-03-04 DIAGNOSIS — O24420 Gestational diabetes mellitus in childbirth, diet controlled: Secondary | ICD-10-CM | POA: Diagnosis present

## 2021-03-04 DIAGNOSIS — Z3A35 35 weeks gestation of pregnancy: Secondary | ICD-10-CM | POA: Diagnosis not present

## 2021-03-04 DIAGNOSIS — O34211 Maternal care for low transverse scar from previous cesarean delivery: Secondary | ICD-10-CM | POA: Diagnosis present

## 2021-03-04 LAB — RESP PANEL BY RT-PCR (FLU A&B, COVID) ARPGX2
Influenza A by PCR: NEGATIVE
Influenza B by PCR: NEGATIVE
SARS Coronavirus 2 by RT PCR: NEGATIVE

## 2021-03-04 LAB — COMPREHENSIVE METABOLIC PANEL
ALT: 25 U/L (ref 0–44)
AST: 28 U/L (ref 15–41)
Albumin: 2.4 g/dL — ABNORMAL LOW (ref 3.5–5.0)
Alkaline Phosphatase: 229 U/L — ABNORMAL HIGH (ref 38–126)
Anion gap: 8 (ref 5–15)
BUN: 6 mg/dL (ref 6–20)
CO2: 18 mmol/L — ABNORMAL LOW (ref 22–32)
Calcium: 9 mg/dL (ref 8.9–10.3)
Chloride: 109 mmol/L (ref 98–111)
Creatinine, Ser: 0.69 mg/dL (ref 0.44–1.00)
GFR, Estimated: >60 mL/min (ref >60)
Glucose, Bld: 93 mg/dL (ref 70–99)
Potassium: 4.1 mmol/L (ref 3.5–5.1)
Sodium: 135 mmol/L (ref 135–145)
Total Bilirubin: 0.4 mg/dL (ref 0.3–1.2)
Total Protein: 6 g/dL — ABNORMAL LOW (ref 6.5–8.1)

## 2021-03-04 LAB — TYPE AND SCREEN
ABO/RH(D): A POS
Antibody Screen: NEGATIVE

## 2021-03-04 LAB — CBC
HCT: 35.1 % — ABNORMAL LOW (ref 36.0–46.0)
Hemoglobin: 11.1 g/dL — ABNORMAL LOW (ref 12.0–15.0)
MCH: 23.5 pg — ABNORMAL LOW (ref 26.0–34.0)
MCHC: 31.6 g/dL (ref 30.0–36.0)
MCV: 74.2 fL — ABNORMAL LOW (ref 80.0–100.0)
Platelets: 462 10*3/uL — ABNORMAL HIGH (ref 150–400)
RBC: 4.73 MIL/uL (ref 3.87–5.11)
RDW: 16.4 % — ABNORMAL HIGH (ref 11.5–15.5)
WBC: 12.3 10*3/uL — ABNORMAL HIGH (ref 4.0–10.5)
nRBC: 0 % (ref 0.0–0.2)

## 2021-03-04 LAB — POCT FERN TEST: POCT Fern Test: POSITIVE

## 2021-03-04 LAB — GLUCOSE, CAPILLARY
Glucose-Capillary: 136 mg/dL — ABNORMAL HIGH (ref 70–99)
Glucose-Capillary: 138 mg/dL — ABNORMAL HIGH (ref 70–99)

## 2021-03-04 LAB — RPR: RPR Ser Ql: NONREACTIVE

## 2021-03-04 SURGERY — Surgical Case
Anesthesia: Spinal | Wound class: Clean Contaminated

## 2021-03-04 MED ORDER — METHYLERGONOVINE MALEATE 0.2 MG PO TABS: 0.2000 mg | ORAL_TABLET | ORAL | Status: DC | PRN

## 2021-03-04 MED ORDER — DIPHENHYDRAMINE HCL 25 MG PO CAPS: 25.0000 mg | ORAL_CAPSULE | Freq: Four times a day (QID) | ORAL | Status: DC | PRN

## 2021-03-04 MED ORDER — DIBUCAINE (PERIANAL) 1 % EX OINT
1.0000 | TOPICAL_OINTMENT | CUTANEOUS | Status: DC | PRN
Start: 2021-03-04 — End: 2021-03-07

## 2021-03-04 MED ORDER — NALBUPHINE HCL 10 MG/ML IJ SOLN
5.0000 mg | INTRAMUSCULAR | Status: DC | PRN
  Administered 2021-03-04 – 2021-03-05 (×3): 5 mg via INTRAVENOUS
  Filled 2021-03-04 (×3): qty 1

## 2021-03-04 MED ORDER — ONDANSETRON HCL 4 MG/2ML IJ SOLN: 4.0000 mg | Freq: Four times a day (QID) | INTRAMUSCULAR | Status: DC | PRN

## 2021-03-04 MED ORDER — DIPHENHYDRAMINE HCL 50 MG/ML IJ SOLN
12.5000 mg | INTRAMUSCULAR | Status: DC | PRN
Start: 2021-03-04 — End: 2021-03-07
  Administered 2021-03-04: 12.5 mg via INTRAVENOUS

## 2021-03-04 MED ORDER — MEPERIDINE HCL 25 MG/ML IJ SOLN
6.2500 mg | INTRAMUSCULAR | Status: DC | PRN
Start: 2021-03-04 — End: 2021-03-04

## 2021-03-04 MED ORDER — OXYCODONE HCL 5 MG/5ML PO SOLN: 5.0000 mg | Freq: Once | ORAL | Status: DC | PRN

## 2021-03-04 MED ORDER — NALBUPHINE HCL 10 MG/ML IJ SOLN: 5.0000 mg | Freq: Once | INTRAMUSCULAR | Status: AC | PRN

## 2021-03-04 MED ORDER — PRENATAL MULTIVITAMIN CH
1.0000 | ORAL_TABLET | Freq: Every day | ORAL | Status: DC
  Administered 2021-03-04: 1 via ORAL
  Filled 2021-03-04: qty 1

## 2021-03-04 MED ORDER — METHYLERGONOVINE MALEATE 0.2 MG/ML IJ SOLN: 0.2000 mg | INTRAMUSCULAR | Status: DC | PRN

## 2021-03-04 MED ORDER — SODIUM CHLORIDE 0.9% FLUSH: 3.0000 mL | INTRAVENOUS | Status: DC | PRN

## 2021-03-04 MED ORDER — FENTANYL CITRATE (PF) 100 MCG/2ML IJ SOLN
INTRAMUSCULAR | Status: DC | PRN
  Administered 2021-03-04: 15 ug via INTRATHECAL

## 2021-03-04 MED ORDER — PHENYLEPHRINE HCL-NACL 20-0.9 MG/250ML-% IV SOLN
INTRAVENOUS | Status: AC
  Filled 2021-03-04: qty 250

## 2021-03-04 MED ORDER — KETOROLAC TROMETHAMINE 30 MG/ML IJ SOLN
30.0000 mg | Freq: Four times a day (QID) | INTRAMUSCULAR | Status: AC | PRN
  Administered 2021-03-04: 30 mg via INTRAVENOUS

## 2021-03-04 MED ORDER — KETOROLAC TROMETHAMINE 30 MG/ML IJ SOLN
30.0000 mg | Freq: Four times a day (QID) | INTRAMUSCULAR | Status: AC
  Administered 2021-03-04 – 2021-03-05 (×2): 30 mg via INTRAVENOUS
  Filled 2021-03-04 (×2): qty 1

## 2021-03-04 MED ORDER — DIPHENHYDRAMINE HCL 25 MG PO CAPS: 25.0000 mg | ORAL_CAPSULE | ORAL | Status: DC | PRN

## 2021-03-04 MED ORDER — KETOROLAC TROMETHAMINE 30 MG/ML IJ SOLN: 30.0000 mg | Freq: Four times a day (QID) | INTRAMUSCULAR | Status: AC | PRN

## 2021-03-04 MED ORDER — ONDANSETRON HCL 4 MG/2ML IJ SOLN
INTRAMUSCULAR | Status: AC
  Filled 2021-03-04: qty 2

## 2021-03-04 MED ORDER — OXYCODONE HCL 5 MG PO TABS: 5.0000 mg | ORAL_TABLET | Freq: Once | ORAL | Status: DC | PRN

## 2021-03-04 MED ORDER — SOD CITRATE-CITRIC ACID 500-334 MG/5ML PO SOLN
ORAL | Status: AC
  Administered 2021-03-04: 30 mL
  Filled 2021-03-04: qty 15

## 2021-03-04 MED ORDER — DIPHENHYDRAMINE HCL 50 MG/ML IJ SOLN
INTRAMUSCULAR | Status: AC
  Filled 2021-03-04: qty 1

## 2021-03-04 MED ORDER — BETAMETHASONE SOD PHOS & ACET 6 (3-3) MG/ML IJ SUSP
12.0000 mg | INTRAMUSCULAR | Status: DC
  Administered 2021-03-04: 12 mg via INTRAMUSCULAR
  Filled 2021-03-04: qty 2
  Filled 2021-03-04: qty 5

## 2021-03-04 MED ORDER — LIDOCAINE HCL 1 % IJ SOLN
INTRAMUSCULAR | Status: AC
  Filled 2021-03-04: qty 20

## 2021-03-04 MED ORDER — ALBUTEROL SULFATE (2.5 MG/3ML) 0.083% IN NEBU: 3.0000 mL | INHALATION_SOLUTION | Freq: Four times a day (QID) | RESPIRATORY_TRACT | Status: DC | PRN

## 2021-03-04 MED ORDER — LACTATED RINGERS IV SOLN: INTRAVENOUS | Status: DC

## 2021-03-04 MED ORDER — STERILE WATER FOR IRRIGATION IR SOLN
Status: DC | PRN
  Administered 2021-03-04: 1000 mL

## 2021-03-04 MED ORDER — FENTANYL CITRATE (PF) 100 MCG/2ML IJ SOLN
INTRAMUSCULAR | Status: AC
  Filled 2021-03-04: qty 2

## 2021-03-04 MED ORDER — DEXAMETHASONE SODIUM PHOSPHATE 10 MG/ML IJ SOLN
INTRAMUSCULAR | Status: DC | PRN
  Administered 2021-03-04: 5 mg via INTRAVENOUS

## 2021-03-04 MED ORDER — NALBUPHINE HCL 10 MG/ML IJ SOLN: 5.0000 mg | INTRAMUSCULAR | Status: DC | PRN

## 2021-03-04 MED ORDER — MORPHINE SULFATE (PF) 0.5 MG/ML IJ SOLN
INTRAMUSCULAR | Status: AC
  Filled 2021-03-04: qty 10

## 2021-03-04 MED ORDER — SENNOSIDES-DOCUSATE SODIUM 8.6-50 MG PO TABS
2.0000 | ORAL_TABLET | Freq: Every day | ORAL | Status: DC
  Administered 2021-03-05 – 2021-03-07 (×3): 2 via ORAL
  Filled 2021-03-04 (×3): qty 2

## 2021-03-04 MED ORDER — OXYTOCIN-SODIUM CHLORIDE 30-0.9 UT/500ML-% IV SOLN
INTRAVENOUS | Status: AC
  Filled 2021-03-04: qty 500

## 2021-03-04 MED ORDER — MORPHINE SULFATE (PF) 0.5 MG/ML IJ SOLN
INTRAMUSCULAR | Status: DC | PRN
  Administered 2021-03-04: .15 mg via INTRATHECAL

## 2021-03-04 MED ORDER — DOCUSATE SODIUM 100 MG PO CAPS
100.0000 mg | ORAL_CAPSULE | Freq: Every day | ORAL | Status: DC
  Administered 2021-03-04: 100 mg via ORAL
  Filled 2021-03-04: qty 1

## 2021-03-04 MED ORDER — CALCIUM CARBONATE ANTACID 500 MG PO CHEW: 2.0000 | CHEWABLE_TABLET | ORAL | Status: DC | PRN

## 2021-03-04 MED ORDER — SODIUM CHLORIDE 0.9 % IR SOLN
Status: DC | PRN
  Administered 2021-03-04: 1000 mL

## 2021-03-04 MED ORDER — SODIUM CHLORIDE 0.9 % IV SOLN: 250.0000 mL | INTRAVENOUS | Status: DC

## 2021-03-04 MED ORDER — OXYTOCIN-SODIUM CHLORIDE 30-0.9 UT/500ML-% IV SOLN: 2.5000 [IU]/h | INTRAVENOUS | Status: AC

## 2021-03-04 MED ORDER — PRENATAL MULTIVITAMIN CH
1.0000 | ORAL_TABLET | Freq: Every day | ORAL | Status: DC
  Administered 2021-03-05 – 2021-03-07 (×3): 1 via ORAL
  Filled 2021-03-04 (×3): qty 1

## 2021-03-04 MED ORDER — ACETAMINOPHEN 325 MG PO TABS: 650.0000 mg | ORAL_TABLET | ORAL | Status: DC | PRN

## 2021-03-04 MED ORDER — ONDANSETRON HCL 4 MG/2ML IJ SOLN
INTRAMUSCULAR | Status: DC | PRN
  Administered 2021-03-04: 4 mg via INTRAVENOUS

## 2021-03-04 MED ORDER — NALOXONE HCL 4 MG/10ML IJ SOLN
1.0000 ug/kg/h | INTRAVENOUS | Status: DC | PRN
  Filled 2021-03-04: qty 5

## 2021-03-04 MED ORDER — COCONUT OIL OIL: 1.0000 "application " | TOPICAL_OIL | Status: DC | PRN

## 2021-03-04 MED ORDER — SCOPOLAMINE 1 MG/3DAYS TD PT72: 1.0000 | MEDICATED_PATCH | Freq: Once | TRANSDERMAL | Status: DC

## 2021-03-04 MED ORDER — BUPIVACAINE HCL (PF) 0.25 % IJ SOLN
INTRAMUSCULAR | Status: AC
  Filled 2021-03-04: qty 30

## 2021-03-04 MED ORDER — SODIUM CHLORIDE 0.9 % IV SOLN
2.0000 g | Freq: Four times a day (QID) | INTRAVENOUS | Status: DC
  Administered 2021-03-04 (×2): 2 g via INTRAVENOUS
  Filled 2021-03-04 (×2): qty 2000

## 2021-03-04 MED ORDER — DEXTROSE 5 % IV SOLN
INTRAVENOUS | Status: DC | PRN
  Administered 2021-03-04: 3 g via INTRAVENOUS

## 2021-03-04 MED ORDER — ZOLPIDEM TARTRATE 5 MG PO TABS: 5.0000 mg | ORAL_TABLET | Freq: Every evening | ORAL | Status: DC | PRN

## 2021-03-04 MED ORDER — NALBUPHINE HCL 10 MG/ML IJ SOLN
5.0000 mg | Freq: Once | INTRAMUSCULAR | Status: AC | PRN
  Administered 2021-03-04: 5 mg via INTRAVENOUS

## 2021-03-04 MED ORDER — FENTANYL CITRATE (PF) 100 MCG/2ML IJ SOLN: 25.0000 ug | INTRAMUSCULAR | Status: DC | PRN

## 2021-03-04 MED ORDER — MORPHINE SULFATE (PF) 0.5 MG/ML IJ SOLN: INTRAMUSCULAR | Status: DC | PRN

## 2021-03-04 MED ORDER — BUPIVACAINE HCL (PF) 0.25 % IJ SOLN
INTRAMUSCULAR | Status: DC | PRN
  Administered 2021-03-04: 10 mL

## 2021-03-04 MED ORDER — NALBUPHINE HCL 10 MG/ML IJ SOLN
INTRAMUSCULAR | Status: AC
  Filled 2021-03-04: qty 1

## 2021-03-04 MED ORDER — WITCH HAZEL-GLYCERIN EX PADS: 1.0000 "application " | MEDICATED_PAD | CUTANEOUS | Status: DC | PRN

## 2021-03-04 MED ORDER — OXYTOCIN-SODIUM CHLORIDE 30-0.9 UT/500ML-% IV SOLN
INTRAVENOUS | Status: DC | PRN
  Administered 2021-03-04: 300 mL via INTRAVENOUS

## 2021-03-04 MED ORDER — FLEET ENEMA 7-19 GM/118ML RE ENEM: 1.0000 | ENEMA | Freq: Every day | RECTAL | Status: DC | PRN

## 2021-03-04 MED ORDER — IBUPROFEN 800 MG PO TABS
800.0000 mg | ORAL_TABLET | Freq: Four times a day (QID) | ORAL | Status: DC
  Administered 2021-03-05 – 2021-03-07 (×10): 800 mg via ORAL
  Filled 2021-03-04 (×9): qty 1
  Filled 2021-03-04: qty 4

## 2021-03-04 MED ORDER — AMOXICILLIN 500 MG PO CAPS: 500.0000 mg | ORAL_CAPSULE | Freq: Three times a day (TID) | ORAL | Status: DC

## 2021-03-04 MED ORDER — NALOXONE HCL 0.4 MG/ML IJ SOLN: 0.4000 mg | INTRAMUSCULAR | Status: DC | PRN

## 2021-03-04 MED ORDER — OXYCODONE HCL 5 MG PO TABS: 5.0000 mg | ORAL_TABLET | ORAL | Status: DC | PRN

## 2021-03-04 MED ORDER — AZITHROMYCIN 250 MG PO TABS
1000.0000 mg | ORAL_TABLET | Freq: Once | ORAL | Status: AC
  Administered 2021-03-04: 1000 mg via ORAL
  Filled 2021-03-04: qty 4

## 2021-03-04 MED ORDER — LACTATED RINGERS IV BOLUS
1000.0000 mL | Freq: Once | INTRAVENOUS | Status: AC
  Administered 2021-03-04: 1000 mL via INTRAVENOUS

## 2021-03-04 MED ORDER — ONDANSETRON HCL 4 MG/2ML IJ SOLN: 4.0000 mg | Freq: Three times a day (TID) | INTRAMUSCULAR | Status: DC | PRN

## 2021-03-04 MED ORDER — SIMETHICONE 80 MG PO CHEW
80.0000 mg | CHEWABLE_TABLET | Freq: Three times a day (TID) | ORAL | Status: DC
  Administered 2021-03-04 – 2021-03-07 (×8): 80 mg via ORAL
  Filled 2021-03-04 (×8): qty 1

## 2021-03-04 MED ORDER — BISACODYL 10 MG RE SUPP: 10.0000 mg | Freq: Every day | RECTAL | Status: DC | PRN

## 2021-03-04 MED ORDER — KETOROLAC TROMETHAMINE 30 MG/ML IJ SOLN
INTRAMUSCULAR | Status: AC
  Filled 2021-03-04: qty 1

## 2021-03-04 MED ORDER — BUPIVACAINE IN DEXTROSE 0.75-8.25 % IT SOLN
INTRATHECAL | Status: DC | PRN
  Administered 2021-03-04: 1.6 mL via INTRATHECAL

## 2021-03-04 MED ORDER — PHENYLEPHRINE HCL-NACL 20-0.9 MG/250ML-% IV SOLN
INTRAVENOUS | Status: DC | PRN
  Administered 2021-03-04: 60 ug/min via INTRAVENOUS

## 2021-03-04 MED ORDER — MENTHOL 3 MG MT LOZG: 1.0000 | LOZENGE | OROMUCOSAL | Status: DC | PRN

## 2021-03-04 MED ORDER — SIMETHICONE 80 MG PO CHEW: 80.0000 mg | CHEWABLE_TABLET | ORAL | Status: DC | PRN

## 2021-03-04 MED ORDER — SODIUM CHLORIDE 0.9% FLUSH: 3.0000 mL | Freq: Two times a day (BID) | INTRAVENOUS | Status: DC

## 2021-03-04 MED ORDER — DEXAMETHASONE SODIUM PHOSPHATE 10 MG/ML IJ SOLN
INTRAMUSCULAR | Status: AC
  Filled 2021-03-04: qty 1

## 2021-03-04 MED ORDER — FERROUS SULFATE 325 (65 FE) MG PO TABS
325.0000 mg | ORAL_TABLET | Freq: Two times a day (BID) | ORAL | Status: DC
  Administered 2021-03-04 – 2021-03-07 (×7): 325 mg via ORAL
  Filled 2021-03-04 (×7): qty 1

## 2021-03-04 SURGICAL SUPPLY — 51 items
APL SKNCLS STERI-STRIP NONHPOA (GAUZE/BANDAGES/DRESSINGS)
BARRIER ADHS 3X4 INTERCEED (GAUZE/BANDAGES/DRESSINGS) ×2 IMPLANT
BENZOIN TINCTURE PRP APPL 2/3 (GAUZE/BANDAGES/DRESSINGS) IMPLANT
BRR ADH 4X3 ABS CNTRL BYND (GAUZE/BANDAGES/DRESSINGS) ×1
CHLORAPREP W/TINT 26ML (MISCELLANEOUS) ×2 IMPLANT
CLAMP CORD UMBIL (MISCELLANEOUS) IMPLANT
CLOTH BEACON ORANGE TIMEOUT ST (SAFETY) ×2 IMPLANT
DECANTER SPIKE VIAL GLASS SM (MISCELLANEOUS) ×2 IMPLANT
DRAPE C SECTION CLR SCREEN (DRAPES) ×2 IMPLANT
DRESSING PREVENA PLUS CUSTOM (GAUZE/BANDAGES/DRESSINGS) ×1 IMPLANT
DRSG OPSITE POSTOP 4X10 (GAUZE/BANDAGES/DRESSINGS) ×2 IMPLANT
DRSG PREVENA PLUS CUSTOM (GAUZE/BANDAGES/DRESSINGS) ×2
ELECT REM PT RETURN 9FT ADLT (ELECTROSURGICAL) ×2
ELECTRODE REM PT RTRN 9FT ADLT (ELECTROSURGICAL) ×1 IMPLANT
EXTRACTOR VACUUM M CUP 4 TUBE (SUCTIONS) IMPLANT
GLOVE BIOGEL PI IND STRL 7.0 (GLOVE) ×2 IMPLANT
GLOVE BIOGEL PI INDICATOR 7.0 (GLOVE) ×2
GLOVE ECLIPSE 6.5 STRL STRAW (GLOVE) ×2 IMPLANT
GOWN STRL REUS W/TWL LRG LVL3 (GOWN DISPOSABLE) ×4 IMPLANT
HOVERMATT SINGLE USE (MISCELLANEOUS) ×2 IMPLANT
KIT ABG SYR 3ML LUER SLIP (SYRINGE) ×2 IMPLANT
NEEDLE HYPO 22GX1.5 SAFETY (NEEDLE) ×4 IMPLANT
NEEDLE HYPO 25X5/8 SAFETYGLIDE (NEEDLE) ×2 IMPLANT
NS IRRIG 1000ML POUR BTL (IV SOLUTION) ×2 IMPLANT
PACK C SECTION WH (CUSTOM PROCEDURE TRAY) ×2 IMPLANT
PAD OB MATERNITY 4.3X12.25 (PERSONAL CARE ITEMS) ×2 IMPLANT
RETRACTOR TRAXI PANNICULUS (MISCELLANEOUS) ×1 IMPLANT
RTRCTR C-SECT PINK 25CM LRG (MISCELLANEOUS) IMPLANT
STRIP CLOSURE SKIN 1/2X4 (GAUZE/BANDAGES/DRESSINGS) IMPLANT
SUT CHROMIC GUT AB #0 18 (SUTURE) IMPLANT
SUT MNCRL 0 VIOLET CTX 36 (SUTURE) ×3 IMPLANT
SUT MON AB 2-0 SH 27 (SUTURE)
SUT MON AB 2-0 SH27 (SUTURE) IMPLANT
SUT MON AB 3-0 SH 27 (SUTURE)
SUT MON AB 3-0 SH27 (SUTURE) IMPLANT
SUT MON AB 4-0 PS1 27 (SUTURE) IMPLANT
SUT MONOCRYL 0 CTX 36 (SUTURE) ×6
SUT PLAIN 2 0 (SUTURE)
SUT PLAIN 2 0 XLH (SUTURE) ×4 IMPLANT
SUT PLAIN ABS 2-0 CT1 27XMFL (SUTURE) IMPLANT
SUT VIC AB 0 CT1 36 (SUTURE) ×4 IMPLANT
SUT VIC AB 2-0 CT1 (SUTURE) ×2 IMPLANT
SUT VIC AB 2-0 CT1 27 (SUTURE) ×2
SUT VIC AB 2-0 CT1 TAPERPNT 27 (SUTURE) ×1 IMPLANT
SUT VIC AB 4-0 PS2 27 (SUTURE) IMPLANT
SYR CONTROL 10ML LL (SYRINGE) ×4 IMPLANT
TOWEL OR 17X24 6PK STRL BLUE (TOWEL DISPOSABLE) ×2 IMPLANT
TRAXI PANNICULUS RETRACTOR (MISCELLANEOUS) ×1
TRAY FOLEY W/BAG SLVR 14FR LF (SET/KITS/TRAYS/PACK) IMPLANT
VACUUM CUP M-STYLE MYSTIC II (SUCTIONS) ×2 IMPLANT
WATER STERILE IRR 1000ML POUR (IV SOLUTION) ×2 IMPLANT

## 2021-03-04 NOTE — Progress Notes (Signed)
Pt prepped and then transferred to 2nd floor for urgent csection called by Dr. Garwin Brothers.

## 2021-03-04 NOTE — MAU Note (Signed)
.  Kathryn Velazquez is a 35 y.o. at [redacted]w[redacted]d here in MAU reporting: Leaking of clear fluid since 0230 am. Denies contractions. +FM.

## 2021-03-04 NOTE — Transfer of Care (Signed)
Immediate Anesthesia Transfer of Care Note  Patient: Kathryn Velazquez  Procedure(s) Performed: CESAREAN SECTION (N/A )  Patient Location: PACU  Anesthesia Type:Spinal  Level of Consciousness: awake  Airway & Oxygen Therapy: Patient Spontanous Breathing  Post-op Assessment: Report given to RN  Post vital signs: Reviewed and stable  Last Vitals:  Vitals Value Taken Time  BP    Temp    Pulse 88 03/04/21 1346  Resp 23 03/04/21 1346  SpO2 100 % 03/04/21 1346  Vitals shown include unvalidated device data.  Last Pain:  Vitals:   03/04/21 0800  TempSrc:   PainSc: 0-No pain      Patients Stated Pain Goal: 3 (56/81/27 5170)  Complications: No complications documented.

## 2021-03-04 NOTE — Anesthesia Postprocedure Evaluation (Signed)
Anesthesia Post Note  Patient: Kathryn Velazquez  Procedure(s) Performed: CESAREAN SECTION (N/A )     Patient location during evaluation: PACU Anesthesia Type: Spinal Level of consciousness: oriented and awake and alert Pain management: pain level controlled Vital Signs Assessment: post-procedure vital signs reviewed and stable Respiratory status: spontaneous breathing, respiratory function stable and patient connected to nasal cannula oxygen Cardiovascular status: blood pressure returned to baseline and stable Postop Assessment: no headache, no backache and no apparent nausea or vomiting Anesthetic complications: no   No complications documented.  Last Vitals:  Vitals:   03/04/21 1545 03/04/21 1645  BP: 126/77   Pulse: 78 92  Resp: 16 17  Temp: 36.6 C 36.6 C  SpO2: 100% 100%    Last Pain:  Vitals:   03/04/21 1645  TempSrc: Oral  PainSc: 0-No pain   Pain Goal: Patients Stated Pain Goal: 3 (03/04/21 3149)                 [name] S

## 2021-03-04 NOTE — H&P (Signed)
Kathryn Velazquez is a 35 y.o. female presenting @ 35 5/[redacted] weeks gestation with SROM @ 2:55 am clear fluid. (+) FM. Previous LTCS. Ate @ 11 pm( fruits and nuts).  Class A1 GDM, polyhydramnios. Previously desired VBAC  OB History    Gravida  2   Para  1   Term  1   Preterm      AB      Living  1     SAB      IAB      Ectopic      Multiple      Live Births  1          Past Medical History:  Diagnosis Date  . Anemia 11/10/2018  . Anxiety and depression   . 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 Medication management 05/28/2020  . Palpitations 01/06/2021  . Pregnancy 01/06/2021  . Thrombocytosis 11/10/2018   Past Surgical History:  Procedure Laterality Date  . CESAREAN SECTION     Family History: family history includes Alcohol abuse in her mother; Aneurysm in her mother; Cerebral aneurysm in her mother; Diabetes in her brother; Hypertension in her father. Social History:  reports that she quit smoking about 15 months ago. Her smoking use included cigarettes. She has never used smokeless tobacco. She reports previous alcohol use. She reports that she does not use drugs.     Maternal Diabetes: Yes:  Diabetes Type:  Diet controlled Genetic Screening: Normal Maternal Ultrasounds/Referrals: Other:polyhydramnios Fetal Ultrasounds or other Referrals:  None, Referred to Materal Fetal Medicine  Maternal Substance Abuse:  No Significant Maternal Medications:  None Significant Maternal Lab Results:  None Other Comments:  previous C/S. polyhydramnios  Review of Systems  All other systems reviewed and are negative.  History Dilation: Closed Effacement (%): Thick Exam by:: S. Alease Fait MD Blood pressure 136/77, pulse (!) 114, temperature 98.2 F (36.8 C), resp. rate 20, last menstrual period 06/20/2020, SpO2 100 %. Maternal Exam:  Introitus: Normal vulva.   Physical Exam Constitutional:      Appearance: Normal appearance.  Eyes:     Extraocular Movements:  Extraocular movements intact.  Cardiovascular:     Rate and Rhythm: Regular rhythm.     Heart sounds: Normal heart sounds.  Pulmonary:     Breath sounds: Normal breath sounds.  Abdominal:     Palpations: Abdomen is soft.     Comments: Gravid soft  Genitourinary:    General: Normal vulva.     Comments: SSE copious clear AF precluded visibility of cervix Digital exam. Long/closed/-3 vtx Musculoskeletal:        General: No swelling.     Cervical back: Neck supple.  Skin:    General: Skin is warm and dry.  Neurological:     Mental Status: She is alert and oriented to person, place, and time.  Psychiatric:        Mood and Affect: Mood normal.        Behavior: Behavior normal.     Prenatal labs: ABO, Rh: --/--/PENDING (04/18 0456) Antibody: PENDING (04/18 0456) Rubella:  Immune RPR:   NR HBsAg:   neg HIV:   Neg GBS:   done today   [place] MFM FETAL BPP W/NONSTRESS  Result Date: 02/27/2021 ----------------------------------------------------------------------  OBSTETRICS REPORT                       (Signed Final 02/27/2021 04:37 pm) ---------------------------------------------------------------------- Patient Info  ID #:       [id number]  D.O.B.:  1985-12-15 (34 yrs)  Name:       Kathryn Velazquez                Visit Date: 02/27/2021 02:29 pm ---------------------------------------------------------------------- Performed By  Attending:        Johnell Comings MD         Ref. Address:     [facility]                                                             OB/GYN                                                             [address]                                                             [place]                                                             [place]  Performed By:     Rodrigo Ran BS      Location:         [facility]                    RD[facility] RVT                                  [facility]                                                             [facility]                                                             [facility]  Referred By:      Alanda Slim  Paulino Cork MD ---------------------------------------------------------------------- Orders  #  Description                           Code        Ordered By  1  [place] MFM OB DETAIL +14 [place]               76811.01    [place]                                                       Rommel Hogston  2  [place] MFM FETAL BPP                      60737.1     Alanda Slim     W/NONSTRESS                                       Demone Lyles ----------------------------------------------------------------------  #  Order #                     Accession #                Episode #  1  [id number]                   [id number]                 [id number]  2  [id number]                   [id number]                 [id number] ---------------------------------------------------------------------- Indications  Gestational diabetes in pregnancy,             O24.415  controlled by oral hypoglycemic drugs  (metformin)  Polyhydramnios, third trimester, antepartum    O40.3XX0  condition or complication, unspecified fetus  [redacted] weeks gestation of pregnancy                Z3A.35  Antenatal screening for malformations          Z36.3  Uterine size-date discrepancy, third trimester O26.843  (S>D)  Obesity complicating pregnancy, third          O99.213  trimester (pregravid BMI 46) ---------------------------------------------------------------------- Fetal Evaluation  Num Of Fetuses:         1  Fetal Heart Rate(bpm):  157  Cardiac Activity:       Observed  Presentation:           Cephalic  Placenta:               Posterior  P. Cord Insertion:      Not well visualized  Amniotic Fluid  AFI FV:      Polyhydramnios  AFI Sum(cm)     %Tile       Largest Pocket(cm)  38.6            > 97        14  RUQ(cm)       RLQ(cm)       LUQ(cm)        LLQ(cm)  9.2  14             8              7.4 ---------------------------------------------------------------------- Biophysical Evaluation  Amniotic F.V:   Polyhydramnios             F. Tone:        Observed  F. Movement:    Observed                   N.S.T:          Reactive  F. Breathing:   Not Observed               Score:          8/10 ---------------------------------------------------------------------- Biometry  BPD:      83.7  mm     G. Age:  33w 5d         17  %    CI:        71.38   %    70 - 86                                                          FL/HC:      21.2   %    20.1 - 22.3  HC:      315.5  mm     G. Age:  35w 3d         26  %    HC/AC:      0.91        0.93 - 1.11  AC:      345.1  mm     G. Age:  38w 3d       > 99  %    FL/BPD:     79.8   %    71 - 87  FL:       66.8  mm     G. Age:  34w 3d         26  %    FL/AC:      19.4   %    20 - 24  HUM:        58  mm     G. Age:  33w 4d         39  %  LV:        2.3  mm  Est. FW:    2975  gm      6 lb 9 oz     88  % ---------------------------------------------------------------------- OB History  Gravidity:    2         Term:   1  Living:       1 ---------------------------------------------------------------------- Gestational Age  LMP:           36w 0d        Date:  06/20/20                 EDD:   03/27/21  U/S Today:     35w 4d                                        EDD:   03/30/21  Best:  35w 0d     Det. ByLoman Chroman         EDD:   04/03/21                                      (08/17/20) ---------------------------------------------------------------------- Anatomy  Cranium:               Appears normal         LVOT:                   Not well visualized  Cavum:                 Appears normal         Aortic Arch:            Appears normal  Ventricles:            Appears normal         Ductal Arch:            Not well visualized  Choroid Plexus:        Appears normal         Diaphragm:              Appears normal  Cerebellum:            Appears normal          Stomach:                Appears normal, left                                                                        sided  Posterior Fossa:       Appears normal         Abdomen:                Appears normal  Nuchal Fold:           Not applicable (>37    Abdominal Wall:         Not well visualized                         wks GA)  Face:                  Appears normal         Cord Vessels:           Not well visualized                         (orbits and profile)  Lips:                  Appears normal         Kidneys:                Appear normal  Palate:                Appears normal         Bladder:  Appears normal  Thoracic:              Appears normal         Spine:                  Appears normal  Heart:                 Appears normal         Upper Extremities:      Visualized                         (4CH, axis, and                         situs)  RVOT:                  Not well visualized    Lower Extremities:      Appears normal  Other:  Feet and right heel visualized. Nasal bone visualized. Lenses          visualized. Technicallly difficult due to advanced GA and maternal          habitus. ---------------------------------------------------------------------- Cervix Uterus Adnexa  Cervix  Not visualized (advanced GA >24wks)  Right Ovary  Not visualized.  Left Ovary  Not visualized.  Cul De Sac  No free fluid seen.  Adnexa  No abnormality visualized. ---------------------------------------------------------------------- Comments  This patient was seen for an exam today due to gestational  diabetes that is currently treated with Metformin and maternal  obesity with a BMI of 46.8.  The patient reports that her  fasting fingerstick values remain elevated in the 100s to 110s  range.  Her 2-hour postprandial fingerstick values have been  within normal limits.  She denies any other problems in her  current pregnancy and reports feeling fetal movements  throughout the day.  She was informed that  the fetal growth measures large for  her gestational age.  Polyhydramnios with a total AFI of 38.6  cm is noted today.  A biophysical profile performed today was 6 out of 8.  She  received a -2 for fetal breathing movements that did not meet  criteria.  She subsequently had a reactive nonstress test,  making her total biophysical profile score 8 out of 10.  The views of the fetal anatomy were limited today due to her  advanced gestational age.  The implications and management of diabetes in pregnancy  was discussed in detail with the patient. She was advised that  our goals for her fingerstick values are fasting values of 90-95  or less and two-hour postprandials of 120 or less.   The  patient was advised that getting her fingerstick values as  close to these goals as possible would provide her with the  most optimal obstetrical outcome.  Due to the large for gestational age fetus with  polyhydramnios and as it appears that her gestational  diabetes is not under great control, I would recommend that  she be delivered at between 62 to 52 weeks.  She should  continue weekly fetal testing in your office until delivery.  No further exams were scheduled in our office. ----------------------------------------------------------------------                   Johnell Comings, MD Electronically Signed Final Report   02/27/2021 04:37 pm ----------------------------------------------------------------------  [place] MFM Fetal BPP Wo Non Stress  Result Date:  02/16/2021 ----------------------------------------------------------------------  OBSTETRICS REPORT                         (Signed Final 02/16/2021 10:21 am) ---------------------------------------------------------------------- Patient Info  ID #:        [id number]                          D.O.B.:  12/14/1985 (34 yrs)  Name:        Kathryn Velazquez                Visit Date: 02/15/2021 06:18 pm ---------------------------------------------------------------------- Performed By   Attending:         Tama High MD        Secondary Phy.:    Alanda Slim                                                               Loana Salvaggio MD  Performed By:      Georgie Chard        Address:           [facility]                                      OB/[facility]                                                               [phone]                                                               [place]                                                               [place]  Referred By:       [facility] MAU/Triage         [facility] ---------------------------------------------------------------------- Orders  #   Description                          Code         Ordered By  1   [place] MFM FETAL BPP WO NON              76819.01     Suttyn Cryder      STRESS ----------------------------------------------------------------------  #   Order #                    Accession #                 Episode #  1   [id number]                  [id number]                  [id number] ---------------------------------------------------------------------- Indications  [redacted] weeks gestation of pregnancy                 Z3A.33  Non-reactive NST                                O28.9 ---------------------------------------------------------------------- Fetal Evaluation  Num Of Fetuses:          1  Fetal Heart Rate(bpm):   162  Cardiac Activity:        Observed  Presentation:            Cephalic  Placenta:                Posterior Fundal  P. Cord Insertion:       Visualized  Amniotic Fluid  AFI FV:      Polyhydramnios  AFI Sum(cm)     %Tile       Largest Pocket(cm)  32.4            > 97        10.8  RUQ(cm)       RLQ(cm)        LUQ(cm)        LLQ(cm)  8.2           7.1            10.8           6.3  ---------------------------------------------------------------------- Biophysical Evaluation  Amniotic F.V:   Pocket => 2 cm              F. Tone:         Observed  F. Movement:    Observed                    Score:           8/8  F. Breathing:   Observed ---------------------------------------------------------------------- Biometry  LV:         3.1  mm ---------------------------------------------------------------------- OB History  Gravidity:     2         Term:  1  Living:        1 ---------------------------------------------------------------------- Gestational Age  Clinical EDD:   33w 2d                                         EDD:  04/03/21  Best:           33w 2d    Det. By:  Clinical EDD               EDD:  04/03/21 ---------------------------------------------------------------------- Anatomy  Ventricles:             Appears normal         Kidneys:                Appear normal  Diaphragm:              Appears normal         Bladder:                Appears normal  Stomach:                Appears normal, left                          sided ---------------------------------------------------------------------- Impression  BPP was requested because of nonreactive NST at prenatal  visit.  Polyhydramnios (AFI 32 cm) is seen. Cephalic presentation.  Antenatal testing is reassuring. BPP 8/8. ----------------------------------------------------------------------                  Tama High, MD Electronically Signed Final Report   02/16/2021 10:21 am ----------------------------------------------------------------------  [place] MFM OB DETAIL +14 WK  Result Date: 02/27/2021 ----------------------------------------------------------------------  OBSTETRICS REPORT                       (Signed Final 02/27/2021 04:37 pm) ---------------------------------------------------------------------- Patient Info  ID #:       [id number]                          D.O.B.:  22-Apr-1986 (34 yrs)  Name:       Kathryn Velazquez                 Visit Date: 02/27/2021 02:29 pm ---------------------------------------------------------------------- Performed By  Attending:        Johnell Comings MD         Ref. Address:     [facility]                                                             OB/GYN                                                             [address] [phone]  [place]                                                             [place]  Performed By:     Rodrigo Ran BS      Location:         [facility]                    RDMS RVT                                 [facility]                                                             [place] [facility]                                                             [facility]  Referred By:      Alanda Slim                    Takaya Hyslop MD ---------------------------------------------------------------------- Orders  #  Description                           Code        Ordered By  1  [place] MFM OB DETAIL +14 [place]               [address]11.01    [place]                                                       Narciso Stoutenburg  2  [place] MFM FETAL BPP                      91694.5     Alanda Slim     W/NONSTRESS                                       Analyn Matusek ----------------------------------------------------------------------  #  Order #                     Accession #                Episode #  1  [id number]                   [id number]                 [id number]  2  [id number]  [id number]                 [id number] ---------------------------------------------------------------------- Indications  Gestational diabetes in pregnancy,             O24.415  controlled by oral hypoglycemic drugs  (metformin)  Polyhydramnios, third trimester, antepartum    O40.3XX0  condition or complication, unspecified fetus  [redacted] weeks gestation of pregnancy                Z3A.35  Antenatal  screening for malformations          Z36.3  Uterine size-date discrepancy, third trimester O26.843  (S>D)  Obesity complicating pregnancy, third          O99.213  trimester (pregravid BMI 46) ---------------------------------------------------------------------- Fetal Evaluation  Num Of Fetuses:         1  Fetal Heart Rate(bpm):  157  Cardiac Activity:       Observed  Presentation:           Cephalic  Placenta:               Posterior  P. Cord Insertion:      Not well visualized  Amniotic Fluid  AFI FV:      Polyhydramnios  AFI Sum(cm)     %Tile       Largest Pocket(cm)  38.6            > 97        14  RUQ(cm)       RLQ(cm)       LUQ(cm)        LLQ(cm)  9.2           14            8               7.4 ---------------------------------------------------------------------- Biophysical Evaluation  Amniotic F.V:   Polyhydramnios             F. Tone:        Observed  F. Movement:    Observed                   N.S.T:          Reactive  F. Breathing:   Not Observed               Score:          8/10 ---------------------------------------------------------------------- Biometry  BPD:      83.7  mm     G. Age:  33w 5d         17  %    CI:        71.38   %    70 - 86                                                          FL/HC:      21.2   %    20.1 - 22.3  HC:      315.5  mm     G. Age:  35w 3d         26  %    HC/AC:      0.91        0.93 - 1.11  AC:      345.1  mm     G. Age:  38w 3d       > 99  %    FL/BPD:     79.8   %    71 - 87  FL:       66.8  mm     G. Age:  34w 3d         26  %    FL/AC:      19.4   %    20 - 24  HUM:        58  mm     G. Age:  33w 4d         39  %  LV:        2.3  mm  Est. FW:    2975  gm      6 lb 9 oz     88  % ---------------------------------------------------------------------- OB History  Gravidity:    2         Term:   1  Living:       1 ---------------------------------------------------------------------- Gestational Age  LMP:           36w 0d        Date:  06/20/20                 EDD:    03/27/21  U/S Today:     35w 4d                                        EDD:   03/30/21  Best:          35w 0d     Det. ByLoman Chroman         EDD:   04/03/21                                      (08/17/20) ---------------------------------------------------------------------- Anatomy  Cranium:               Appears normal         LVOT:                   Not well visualized  Cavum:                 Appears normal         Aortic Arch:            Appears normal  Ventricles:            Appears normal         Ductal Arch:            Not well visualized  Choroid Plexus:        Appears normal         Diaphragm:              Appears normal  Cerebellum:            Appears normal         Stomach:                Appears normal, left  sided  Posterior Fossa:       Appears normal         Abdomen:                Appears normal  Nuchal Fold:           Not applicable (>29    Abdominal Wall:         Not well visualized                         wks GA)  Face:                  Appears normal         Cord Vessels:           Not well visualized                         (orbits and profile)  Lips:                  Appears normal         Kidneys:                Appear normal  Palate:                Appears normal         Bladder:                Appears normal  Thoracic:              Appears normal         Spine:                  Appears normal  Heart:                 Appears normal         Upper Extremities:      Visualized                         (4CH, axis, and                         situs)  RVOT:                  Not well visualized    Lower Extremities:      Appears normal  Other:  Feet and right heel visualized. Nasal bone visualized. Lenses          visualized. Technicallly difficult due to advanced GA and maternal          habitus. ---------------------------------------------------------------------- Cervix Uterus Adnexa  Cervix  Not visualized (advanced GA  >24wks)  Right Ovary  Not visualized.  Left Ovary  Not visualized.  Cul De Sac  No free fluid seen.  Adnexa  No abnormality visualized. ---------------------------------------------------------------------- Comments  This patient was seen for an exam today due to gestational  diabetes that is currently treated with Metformin and maternal  obesity with a BMI of 46.8.  The patient reports that her  fasting fingerstick values remain elevated in the 100s to 110s  range.  Her 2-hour postprandial fingerstick values have been  within normal limits.  She denies any other problems in her  current pregnancy and reports feeling fetal movements  throughout the day.  She was informed that the fetal growth measures large for  her gestational age.  Polyhydramnios with a total AFI of 38.6  cm is noted today.  A biophysical profile performed today was 6 out of 8.  She  received a -2 for fetal breathing movements that did not meet  criteria.  She subsequently had a reactive nonstress test,  making her total biophysical profile score 8 out of 10.  The views of the fetal anatomy were limited today due to her  advanced gestational age.  The implications and management of diabetes in pregnancy  was discussed in detail with the patient. She was advised that  our goals for her fingerstick values are fasting values of 90-95  or less and two-hour postprandials of 120 or less.   The  patient was advised that getting her fingerstick values as  close to these goals as possible would provide her with the  most optimal obstetrical outcome.  Due to the large for gestational age fetus with  polyhydramnios and as it appears that her gestational  diabetes is not under great control, I would recommend that  she be delivered at between 68 to 63 weeks.  She should  continue weekly fetal testing in your office until delivery.  No further exams were scheduled in our office. ----------------------------------------------------------------------                    Johnell Comings, MD Electronically Signed Final Report   02/27/2021 04:37 pm ---------------------------------------------------------------------- tracing: baseline 145 (+) accel to 160  occ variable  ctx q 4 mins )( not perceived by pt) Assessment/Plan: PPROM not in labor IUP @ 35 5/7 wk Class A1 GDM Polyhydramnios Previous C/S Morbid obesity Reviewed with pt and husband : PPROM <37 wk: expectant mgmt vs delivery. Pro and con disc. Pt will speak with neonatologist Consult requested Also disc have option for VBAC vs repeat C/S. Pt to decide P) admit to antepartum. Give steroid, start PPROM  antibiotics Cont fetal monitoring CBG 4x/d( FBS, 2hr postprandial) NICU consult   Jaskirat Zertuche A Jakhi Dishman 03/04/2021, 5:48 AM

## 2021-03-04 NOTE — Brief Op Note (Signed)
03/04/2021  1:33 PM  PATIENT:  Kathryn Velazquez  35 y.o. female  PRE-OPERATIVE DIAGNOSIS:  fetal heart rate decelaration, previous cesarean section , PPROM, IUP @ 35 5/7 wk Class A1 GDM  POST-OPERATIVE DIAGNOSIS:  fetal heart rate decelaration, previous cesarean section , PPROM, IUP@ 35 5/7 wk  PROCEDURE:  Repeat Cesarean section, kerr hysterotomy  SURGEON:  Surgeon(s) and Role:    * Meilin Brosh, Alanda Slim, MD - Primary  PHYSICIAN ASSISTANT:   ASSISTANTS: none   ANESTHESIA:   spinal  EBL:  446 ml  Findings: live female ROT hyperextended vtx, nl tubes, bilateral ovaries with small 1cm appendages BLOOD ADMINISTERED:none  DRAINS: none   LOCAL MEDICATIONS USED:  MARCAINE     SPECIMEN:  Source of Specimen:  Placenta  DISPOSITION OF SPECIMEN:  PATHOLOGY  COUNTS:  YES  TOURNIQUET:  * No tourniquets in log *  DICTATION: .Other Dictation: Dictation Number [id number]  PLAN OF CARE: Admit to inpatient   PATIENT DISPOSITION:  PACU - hemodynamically stable.   Delay start of Pharmacological VTE agent (>24hrs) due to surgical blood loss or risk of bleeding: no

## 2021-03-04 NOTE — Anesthesia Procedure Notes (Signed)
Spinal  Patient location during procedure: OR Start time: 03/04/2021 12:15 PM End time: 03/04/2021 12:19 PM Reason for block: surgical anesthesia Staffing Performed: anesthesiologist  Anesthesiologist: Albertha Ghee, MD Preanesthetic Checklist Completed: patient identified, IV checked, risks and benefits discussed, surgical consent, monitors and equipment checked, pre-op evaluation and timeout performed Spinal Block Patient position: sitting Prep: DuraPrep Patient monitoring: cardiac monitor, continuous pulse ox and blood pressure Approach: midline Location: L3-4 Injection technique: single-shot Needle Needle type: Pencan  Needle gauge: 24 G Needle length: 9 cm Assessment Sensory level: T10 Events: CSF return Additional Notes Functioning IV was confirmed and monitors were applied. Sterile prep and drape, including hand hygiene and sterile gloves were used. The patient was positioned and the spine was prepped. The skin was anesthetized with lidocaine.  Free flow of clear CSF was obtained prior to injecting local anesthetic into the CSF.  The spinal needle aspirated freely following injection.  The needle was carefully withdrawn.  The patient tolerated the procedure well.

## 2021-03-04 NOTE — Anesthesia Preprocedure Evaluation (Signed)
Anesthesia Evaluation  Patient identified by MRN, date of birth, ID band Patient awake    Reviewed: Allergy & Precautions, H&P , NPO status , Patient's Chart, lab work & pertinent test results  Airway Mallampati: II   Neck ROM: full    Dental   Pulmonary former smoker,    breath sounds clear to auscultation       Cardiovascular negative cardio ROS   Rhythm:regular Rate:Normal     Neuro/Psych PSYCHIATRIC DISORDERS Anxiety Depression    GI/Hepatic   Endo/Other  Morbid obesity  Renal/GU      Musculoskeletal   Abdominal   Peds  Hematology   Anesthesia Other Findings   Reproductive/Obstetrics (+) Pregnancy H/o prior C/S                              Anesthesia Physical Anesthesia Plan  ASA: II  Anesthesia Plan: Spinal   Post-op Pain Management:    Induction: Intravenous  PONV Risk Score and Plan: 2 and Ondansetron and Treatment may vary due to age or medical condition  Airway Management Planned: Simple Face Mask  Additional Equipment:   Intra-op Plan:   Post-operative Plan:   Informed Consent: I have reviewed the patients History and Physical, chart, labs and discussed the procedure including the risks, benefits and alternatives for the proposed anesthesia with the patient or authorized representative who has indicated his/her understanding and acceptance.     Dental advisory given  Plan Discussed with: CRNA, Anesthesiologist and Surgeon  Anesthesia Plan Comments:         Anesthesia Quick Evaluation

## 2021-03-04 NOTE — Progress Notes (Signed)
Called by RN regarding 6 min deceleration.  Tracing reviewed> baseline 140 (+) decel down to 100 with slow recover to 150 over 6 mins.  Subsequent tracing with min variability small accel  BP 117/68 (BP Location: Right Arm)   Pulse (!) 101   Temp 98 F (36.7 C) (Oral)   Resp 19   Ht 5\' 2"  (1.575 m)   Wt 124 kg   LMP 06/20/2020   SpO2 100%   BMI 50.00 kg/m     IMP: PPROM with unfavorable cervix  Fetal heart deceleration IUP@ 35 5/7 wk Previous C/S P) proceed with repeat C/S.  Risk of surgery reviewed Including infection, bleeding, poss need for blood transfusion And its risk, injury to surrounding organ structures, internal scar tissue Consent signed. To OR

## 2021-03-05 LAB — CBC
HCT: 28.4 % — ABNORMAL LOW (ref 36.0–46.0)
Hemoglobin: 9.3 g/dL — ABNORMAL LOW (ref 12.0–15.0)
MCH: 23.9 pg — ABNORMAL LOW (ref 26.0–34.0)
MCHC: 32.7 g/dL (ref 30.0–36.0)
MCV: 73 fL — ABNORMAL LOW (ref 80.0–100.0)
Platelets: 405 10*3/uL — ABNORMAL HIGH (ref 150–400)
RBC: 3.89 MIL/uL (ref 3.87–5.11)
RDW: 16 % — ABNORMAL HIGH (ref 11.5–15.5)
WBC: 21.2 10*3/uL — ABNORMAL HIGH (ref 4.0–10.5)
nRBC: 0.1 % (ref 0.0–0.2)

## 2021-03-05 NOTE — Op Note (Signed)
NAMEWEDNESDAY, ERICSSON MEDICAL RECORD NO: [id number] ACCOUNT NO: [id number] DATE OF BIRTH: [birth year] FACILITY: [facility] LOCATION: [facility] PHYSICIAN: Llewyn Heap A. Garwin Brothers, MD  Operative Report   DATE OF PROCEDURE: 03/04/2021  PREOPERATIVE DIAGNOSES:  Preterm premature rupture of membranes, intrauterine gestation at 10 and 5/7 weeks, class A1 gestational diabetes, previous cesarean section, fetal heart rate deceleration.  PROCEDURE:  Repeat cesarean section, Kerr hysterotomy.  POSTOPERATIVE DIAGNOSES:  Preterm premature rupture of membranes, intrauterine gestation at 38 and 5/7 weeks, class A1 gestational diabetes, previous cesarean section, fetal heart rate deceleration.  ANESTHESIA:  Spinal.  SURGEON:  Yarlin Breisch A. Garwin Brothers, MD  ASSISTANT:  None.  DESCRIPTION OF PROCEDURE:  Under adequate spinal anesthesia, the patient was placed in the supine position with a left lateral tilt.  She was sterilely prepped and draped in the usual fashion.  A Traxit was placed due to the patient's large pannus.   0.25% Marcaine was injected along the previous Pfannenstiel skin incision site.  Pfannenstiel skin incision was then made, carried down to the rectus fascia.  The rectus fascia was opened transversely.  Rectus fascia was then bluntly and sharply  dissected off the rectus muscles in a superior and inferior fashion.  The rectus fascia was split in the midline.  The parietal peritoneum was bluntly entered superiorly in order to facilitate the dissection of the rectus fascia off the rectus muscle  further.  A bleeding perforating vessel was noted on the right muscle, which was bleeding and this was grasped with a snap and 2-0 Vicryl figure-of-eight suture was placed around it.  The parietal peritoneum was further extended superiorly and inferiorly  and the rectus muscle was further split in the midline inferiorly.  A self-retaining Alexis retractor was then placed.  The uterus was noted to be significantly  levorotated.  Once it was placed in its midline, the lower uterine segment was noted.  The  vesicouterine peritoneum was opened transversely and the bladder sharply dissected off the lower segment.  A curvilinear lower uterine segment incision was then made and extended bluntly in a cephalic and caudad manner.  Attempts at delivering the fetus  was initially unsuccessful despite multiple attempts and the bandage scissor was then used to extend the incision on the left side.  Subsequent attempt was also unsuccessful.  The head was hyperextended and right occiput transverse position.  Once deflexed of the head,  vacuum was then applied with subsequently delivery of that live female.  The baby was delayed cord clamp x1 minute.  The cord was subsequently clamped and cut.  The baby was transferred to the waiting pediatrician who assigned the Apgars of 9 and 9 at one and five minutes respectively.  The uterus was massaged while awaiting for the placenta. Subsequently normal tubes were noted bilaterally.  Both ovaries had small pedunculated tissue, two on the right and one on the left and they were not removed.  The  ovaries otherwise were small.  Manual extraction of the placenta was then occurred with a large amount of trailing membranes that was noted subsequently.  The ring clamp was used to remove the trailing membranes as well as dry cloth to clean the uterine cavity The uterine cavity was cleaned all the way to the fundus without any further tissue noted.  At that point, the uterine incision was closed in two layers, the first layer with 0 Monocryl running locked stitch, second layer was imbricating  using 0 Monocryl suture.  Abdomen was irrigated and suctioned of debris.  Interceed was placed overlying the incision in an inverted fashion.  The self-retaining retractor was then removed.  It was again noted that there was an additional bleeder to  the right of the other perforated vessel, which was then  clamped and suture ligated with 2-0 Vicryl suture, again with good hemostasis noted.  Small bleeding on the fascial side bleeder was cauterized with good hemostasis noted.  At that point, the parietal  peritoneum was closed with 2-0 Vicryl.  The rectus fascia was closed with 0 Vicryl x2.  The subcutaneous area was irrigated, small bleeders cauterized.  Interrupted 2-0 plain sutures placed and the skin approximated using 4-0 Vicryl subcuticular closure.  Due to the depth of the incision, a Prevena wound VAC was placed.  SPECIMEN:  Placenta was sent to pathology due to the preterm premature rupture of membranes.  ESTIMATED BLOOD LOSS:  446 mL.  INTRAOPERATIVE FLUIDS:  1500 mL.  URINE OUTPUT:  200 mL.  COUNTS:  Sponge and instrument counts x2 was correct.  COMPLICATIONS:  None.  The patient tolerated the procedure well and was transferred to recovery room in stable condition.   NIK D: 03/04/2021 7:27:34 pm T: 03/05/2021 10:24:00 am  JOB: [id number]/ [id number]

## 2021-03-05 NOTE — Progress Notes (Signed)
Reviewed chart and concur with student's documentation.

## 2021-03-05 NOTE — Social Work (Signed)
CSW received consult for hx of Anxiety and Depression. CSW met with MOB to offer support and complete assessment.    CSW met with MOB at bedside. CSW introduced role and congratulated MOB. CSW observed MOB in bed and the infant sleeping in bassinet. CSW explain reason for the visit. MOB calm and receptive to CSW visit. CSW asked MOB how she feels since giving birth. MOB reports, " I feel good."    CSW asked MOB about her mental health diagnosis of anxiety and depression. MOB acknowledges she has a diagnosis of anxiety/ depression. MOB denies having other mental health diagnosis. MOB reports she was diagnosed years ago due to life stressors/situational events. MOB reports she was prescribed Latuda which she took for about a year. MOB reports she stopped taking the medication so that she could learn to self-manage her emotions. MOB reports she sees a therapist weekly at [facility]. MOB plans to continue postpartum. MOB reports she and the therapist have discussed  postpartum care and follow up. CSW assessed MOB for safety. MOB denies thoughts of harm to self and others. MOB denies domestic violence. CSW asked MOB who are her supports. MOB acknowledges her finance, father, sister and friends as supports.   CSW provided education to MOB regarding the baby blues period vs. perinatal mood disorders, discussed treatment and gave resources for reference, if needed. CSW asked MOB if she experience postpartum depression with her last birth. MOB reports she gave birth thirteen years ago and she does not recall having symptoms. CSW recommended MOB complete a self-evaluation during the postpartum time period using the New Mom Checklist from Postpartum Progress and encouraged MOB to contact her doctor if concerns arise. MOB receptive to the checklist. CSW provided review of Sudden Infant Death Syndrome (SIDS) precautions and informed MOB no co-sleeping with the infant. MOB inquired if she should take  precautions with vaping. CSW educated MOB about secondhand smoke and encouraged MOB to change shirt and wash hands before holding the infant.   CSW asked MOB if she has essential items for the infant. MOB reports she has essential items for the infant including a car seat and bassinet. CSW assessment MOB for further needs. MOB receptive to WIC resources. MOB will apply to determine if she qualifies.    CSW identifies no further need for intervention and no barriers to discharge at this time.  Lizania Bouchard, MSW, LCSW [facility]  Clinical Social Worker  [phone] 03/05/2021  10:20 AM 

## 2021-03-05 NOTE — Progress Notes (Signed)
SVD: POD #1  S:  Pt reports feeling well/ Tolerating po/ Voiding without problems/ No n/v/ Bleeding is moderate/ Pain controlled withprescription NSAID's including ibuprofen (Motrin) and narcotic analgesics including oxycodone/acetaminophen (Percocet, Tylox)    O:  A & O x 3  VS: Blood pressure 114/71, pulse 85, temperature 98 F (36.7 C), temperature source Oral, resp. rate 18, height 5\' 2"  (1.575 m), weight 124 kg, last menstrual period 06/20/2020, SpO2 100 %, unknown if currently breastfeeding.  LABS:  Results for orders placed or performed during the hospital encounter of 03/04/21 (from the past 24 hour(s))  Glucose, capillary     Status: Abnormal   Collection Time: 03/04/21  1:54 PM  Result Value Ref Range   Glucose-Capillary 138 (H) 70 - 99 mg/dL  CBC     Status: Abnormal   Collection Time: 03/05/21  4:04 AM  Result Value Ref Range   WBC 21.2 (H) 4.0 - 10.5 K/uL   RBC 3.89 3.87 - 5.11 MIL/uL   Hemoglobin 9.3 (L) 12.0 - 15.0 g/dL   HCT 28.4 (L) 36.0 - 46.0 %   MCV 73.0 (L) 80.0 - 100.0 fL   MCH 23.9 (L) 26.0 - 34.0 pg   MCHC 32.7 30.0 - 36.0 g/dL   RDW 16.0 (H) 11.5 - 15.5 %   Platelets 405 (H) 150 - 400 K/uL   nRBC 0.1 0.0 - 0.2 %    I&O: I/O last 3 completed shifts: In: -  Out: 1771 [Urine:1325; Blood:446]   No intake/output data recorded.  Lungs: chest clear, no wheezing, rales, normal symmetric air entry  Heart: regular rate and rhythm  Abdomen: obese soft. Wound vac in place. Very low umb  with pannus. Fundus firm at appropriate level  Perineum: not inspected  Lochia: mod  Extremities:no redness or tenderness in the calves or thighs, no edema    A/P: POD # 1/PPD # 1 [id number] Class A1 GDM  Doing well  Continue routine post partum orders  Anticipate D/c in am

## 2021-03-06 LAB — SURGICAL PATHOLOGY

## 2021-03-06 LAB — CULTURE, BETA STREP (GROUP B ONLY)

## 2021-03-06 NOTE — Progress Notes (Signed)
SVD: repeat  S:  Pt reports feeling well. Interested in early Paramedic po/ Voiding without problems/ No n/v/ Bleeding is light/ Pain controlled withprescription NSAID's including ibuprofen (Motrin) and narcotic analgesics including oxycodone/acetaminophen (Percocet, Tylox)    O:  A & O x 3 / VS: Blood pressure 118/73, pulse 89, temperature 98 F (36.7 C), temperature source Oral, resp. rate 13, height 5\' 2"  (1.575 m), weight 124 kg, last menstrual period 06/20/2020, SpO2 98 %, unknown if currently breastfeeding.  LABS: No results found for this or any previous visit (from the past 24 hour(s)).  I&O: I/O last 3 completed shifts: In: -  Out: 1000 [Urine:1000]   No intake/output data recorded.  Lungs: chest clear, no wheezing, rales, normal symmetric air entry  Heart: regular rate and rhythm, S1, S2 normal, no murmur, click, rub or gallop  Abdomen: soft obese uterus fundus firm  dressing intact  Perineum: not inspected  Lochia:   Extremities:no redness or tenderness in the calves or thighs, no edema    A/P: POD # 2/PPD # 2 [id number]  Doing well  Continue routine post partum orders Await disposition of baby to determine early discharge D/c instructions reviewed

## 2021-03-07 MED ORDER — IBUPROFEN 800 MG PO TABS: 800.0000 mg | ORAL_TABLET | Freq: Four times a day (QID) | ORAL | 11 refills | Status: DC | PRN

## 2021-03-07 MED ORDER — OXYCODONE HCL 5 MG PO TABS: 5.0000 mg | ORAL_TABLET | ORAL | 0 refills | Status: AC | PRN

## 2021-03-07 MED ORDER — METHYLERGONOVINE MALEATE 0.2 MG PO TABS: 0.2000 mg | ORAL_TABLET | Freq: Four times a day (QID) | ORAL | 0 refills | Status: AC

## 2021-03-07 NOTE — Discharge Instructions (Signed)
Call if temperature greater than equal to 100.4, nothing per vagina for 4-6 weeks or severe nausea vomiting, increased incisional pain , drainage or redness in the incision site, no straining with bowel movements, showers no bathCall if temperature greater than equal to 100.4, nothing per vagina for 4-6 weeks or severe nausea vomiting, increased incisional pain , drainage or redness in the incision site, no straining with bowel movements, showers no bath

## 2021-03-07 NOTE — Progress Notes (Signed)
SVD: repeat  S:  Pt reports feeling well/ Tolerating po/ Voiding without problems/ No n/v/ Bleeding is moderate/ Pain controlled withprescription NSAID's including ibuprofen (Motrin) and narcotic analgesics including oxycodone/acetaminophen (Percocet, Tylox)    O:  A & O x 3 / VS: Blood pressure 128/69, pulse 88, temperature 97.9 F (36.6 C), temperature source Oral, resp. rate 18, height 5\' 2"  (1.575 m), weight 124 kg, last menstrual period 06/20/2020, SpO2 100 %, unknown if currently breastfeeding.  LABS: No results found for this or any previous visit (from the past 24 hour(s)).  I&O: No intake/output data recorded.   No intake/output data recorded.  Lungs: chest clear, no wheezing, rales, normal symmetric air entry  Heart: regular rate and rhythm, S1, S2 normal, no murmur, click, rub or gallop  Abdomen: soft obese  Perineum: not inspected  Lochia: moderate  Extremities:no redness or tenderness in the calves or thighs, edema tr+    A/P: POD # 3/PPD # 3/ [id number]  Doing well  Continue routine post partum orders  D/c home F/u 4/26 wound VAC removal  Script sent

## 2021-03-07 NOTE — Discharge Summary (Signed)
Postpartum Discharge Summary  Date of Service updated     Patient Name: Kathryn Velazquez DOB: 21-Jan-1986 MRN: [id number]  Date of admission: 03/04/2021 Delivery date:03/04/2021  Delivering provider: Jailynn Lavalais  Date of discharge: 03/07/2021  Admitting diagnosis: PPROM, ClassA2 GDM, Previous Cesarean section, IUP @ 81 5/7 wk, Intrauterine pregnancy: [redacted]w[redacted]d     Secondary diagnosis:  Active Problems:   Preterm premature rupture of membranes   Postpartum care following cesarean delivery Polyhydramnios Additional problems: Class A2 GDM, morbid obesity   Discharge diagnosis: Preterm Pregnancy Delivered and GDM A2  , Previous Cesarean section, postoperative anemia, morbid obesity                                            Post partum procedures: none Augmentation: N/A Complications: None  Hospital course: pt was admitted with PPROM @ 35 5/7 wk. Hx previous C/S. Pt was initially given BMZ and started on PROM antibiotics. She was transferred to Antepartum unit. Pt was on continuous monitoring when she had a six minute deceleration. Pt was admitted with unfavorable cervix. Given this finding, she was counseled and proceeded to having repeat C/S. See operative report. Pt had uncomplicated postpartum course. Pt remained for 3 day due to bilirubin testing for baby  Magnesium Sulfate received: No BMZ received: Yes Rhophylac:No MMR:No T-DaP:Given prenatally Flu: No Transfusion:No  Physical exam  Vitals:   03/05/21 1900 03/06/21 0534 03/06/21 2020 03/07/21 0525  BP: 127/70 118/73 136/81 128/69  Pulse: 93 89 96 88  Resp: $Remo'20 13 18 18  'JHYGb$ Temp: 97.9 F (36.6 C) 98 F (36.7 C) 97.8 F (36.6 C) 97.9 F (36.6 C)  TempSrc: Oral Oral Axillary Oral  SpO2: 100% 98% 100% 100%  Weight:      Height:       General: alert, cooperative, and no distress Lochia: appropriate Uterine Fundus: firm Incision: Dressing is clean, dry, and intact DVT Evaluation: No evidence of DVT seen on  physical exam. Calf/Ankle edema is present Labs: Lab Results  Component Value Date   WBC 21.2 (H) 03/05/2021   HGB 9.3 (L) 03/05/2021   HCT 28.4 (L) 03/05/2021   MCV 73.0 (L) 03/05/2021   PLT 405 (H) 03/05/2021   CMP Latest Ref Rng & Units 03/04/2021  Glucose 70 - 99 mg/dL 93  BUN 6 - 20 mg/dL 6  Creatinine 0.44 - 1.00 mg/dL 0.69  Sodium 135 - 145 mmol/L 135  Potassium 3.5 - 5.1 mmol/L 4.1  Chloride 98 - 111 mmol/L 109  CO2 22 - 32 mmol/L 18(L)  Calcium 8.9 - 10.3 mg/dL 9.0  Total Protein 6.5 - 8.1 g/dL 6.0(L)  Total Bilirubin 0.3 - 1.2 mg/dL 0.4  Alkaline Phos 38 - 126 U/L 229(H)  AST 15 - 41 U/L 28  ALT 0 - 44 U/L 25   Edinburgh Score: Edinburgh Postnatal Depression Scale Screening Tool 03/05/2021  I have been able to laugh and see the funny side of things. 0  I have looked forward with enjoyment to things. 0  I have blamed myself unnecessarily when things went wrong. 1  I have been anxious or worried for no good reason. 1  I have felt scared or panicky for no good reason. 1  Things have been getting on top of me. 0  I have been so unhappy that I have had difficulty sleeping. 0  I have  felt sad or miserable. 0  I have been so unhappy that I have been crying. 0  The thought of harming myself has occurred to me. 0  Edinburgh Postnatal Depression Scale Total 3      After visit meds:  Allergies as of 03/07/2021   No Known Allergies      Medication List     STOP taking these medications    metFORMIN 500 MG 24 hr tablet Commonly known as: GLUCOPHAGE-XR       TAKE these medications    albuterol 108 (90 Base) MCG/ACT inhaler Commonly known as: VENTOLIN HFA Inhale 2 puffs into the lungs every 6 (six) hours as needed for wheezing or shortness of breath.   ibuprofen 800 MG tablet Commonly known as: ADVIL Take 1 tablet (800 mg total) by mouth every 6 (six) hours as needed.   iron polysaccharides 150 MG capsule Commonly known as: NIFEREX Take 1 capsule (150  mg total) by mouth 2 (two) times daily.   methylergonovine 0.2 MG tablet Commonly known as: METHERGINE Take 1 tablet (0.2 mg total) by mouth 4 (four) times daily for 3 days.   oxyCODONE 5 MG immediate release tablet Commonly known as: Oxy IR/ROXICODONE Take 1-2 tablets (5-10 mg total) by mouth every 4 (four) hours as needed for up to 7 days for moderate pain.   pantoprazole 40 MG tablet Commonly known as: PROTONIX Take 40 tablets by mouth daily.   prenatal multivitamin Tabs tablet Take 1 tablet by mouth daily at 12 noon.               Discharge Care Instructions  (From admission, onward)           Start     Ordered   03/07/21 0000  Discharge wound care:       Comments: Call office for wound vac removal on tuesday   03/07/21 0901             Discharge home in stable condition Infant Feeding: Bottle Infant Disposition:home with mother Discharge instruction: per After Visit Summary and Postpartum booklet. Activity: Advance as tolerated. Pelvic rest for 6 weeks.  Diet: routine diet Anticipated Birth Control: Unsure Postpartum Appointment:1 week Additional Postpartum F/U: Incision check 1 week Future Appointments:No future appointments. Follow up Visit:  Follow-up Information     Servando Salina, MD. Schedule an appointment as soon as possible for a visit.   Specialty: Obstetrics and Gynecology Why: call for removal of vacuum on Tuesday Contact information: [address] [place] [address] [phone]                     03/07/2021 Marvene Staff, MD

## 2021-03-08 ENCOUNTER — Encounter (HOSPITAL_COMMUNITY): Payer: Self-pay | Admitting: Obstetrics and Gynecology

## 2021-03-08 ENCOUNTER — Ambulatory Visit: Payer: BC Managed Care – PPO

## 2021-03-08 ENCOUNTER — Other Ambulatory Visit: Payer: Self-pay

## 2021-03-08 ENCOUNTER — Inpatient Hospital Stay (HOSPITAL_COMMUNITY)
Admission: AD | Admit: 2021-03-08 | Discharge: 2021-03-08 | Disposition: A | Discharge status: Home / Self Care | Payer: BC Managed Care – PPO | Attending: Obstetrics and Gynecology | Admitting: Obstetrics and Gynecology

## 2021-03-08 DIAGNOSIS — M7989 Other specified soft tissue disorders: Secondary | ICD-10-CM

## 2021-03-08 DIAGNOSIS — Z87891 Personal history of nicotine dependence: Secondary | ICD-10-CM | POA: Diagnosis not present

## 2021-03-08 DIAGNOSIS — O1205 Gestational edema, complicating the puerperium: Secondary | ICD-10-CM | POA: Insufficient documentation

## 2021-03-08 DIAGNOSIS — O99215 Obesity complicating the puerperium: Secondary | ICD-10-CM | POA: Insufficient documentation

## 2021-03-08 MED ORDER — ENOXAPARIN SODIUM 120 MG/0.8ML ~~LOC~~ SOLN
120.0000 mg | Freq: Once | SUBCUTANEOUS | Status: AC
  Administered 2021-03-08: 120 mg via SUBCUTANEOUS
  Filled 2021-03-08: qty 0.8

## 2021-03-08 NOTE — MAU Provider Note (Signed)
History     Chief Complaint  Patient presents with   Leg Swelling     35 yo G2P1102 BF POD #3 s/p repeat C/S presents with c/o right leg swelling and tightness  of calf  noted since she [place] home. Pt denies leg pain. Pt was just sent home today.  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   Medication management 05/28/2020   Palpitations 01/06/2021   Pregnancy 01/06/2021   Thrombocytosis 11/10/2018    Past Surgical History:  Procedure Laterality Date   CESAREAN SECTION     CESAREAN SECTION N/A 03/04/2021   Procedure: CESAREAN SECTION;  Surgeon: Servando Salina, MD;  Location: [facility] LD [facility];  Service: Obstetrics;  Laterality: N/A;    Family History  Problem Relation Age of Onset   Aneurysm Mother    Alcohol abuse Mother    Cerebral aneurysm Mother    Hypertension Father    Diabetes Brother     Social History   Tobacco Use   Smoking status: Former Smoker    Types: Cigarettes    Quit date: 2021    Years since quitting: 1.3   Smokeless tobacco: Never Used   Tobacco comment: twice a month  Vaping Use   Vaping Use: Never used  Substance Use Topics   Alcohol use: Not Currently    Comment: rare   Drug use: No    Allergies: No Known Allergies  Medications Prior to Admission  Medication Sig Dispense Refill Last Dose   ibuprofen (ADVIL) 800 MG tablet Take 1 tablet (800 mg total) by mouth every 6 (six) hours as needed. 30 tablet 11 03/08/2021 at Unknown time   iron polysaccharides (NIFEREX) 150 MG capsule Take 1 capsule (150 mg total) by mouth 2 (two) times daily. 60 capsule 6 03/08/2021 at Unknown time   Prenatal Vit-Fe Fumarate-FA (PRENATAL MULTIVITAMIN) TABS tablet Take 1 tablet by mouth daily at 12 noon.    03/08/2021 at Unknown time   albuterol (VENTOLIN HFA) 108 (90 Base) MCG/ACT inhaler Inhale 2 puffs into the lungs every 6 (six) hours as needed for wheezing or shortness of breath. 8 g 0    methylergonovine (METHERGINE) 0.2 MG tablet Take 1 tablet (0.2 mg total) by mouth 4 (four) times daily for 3 days. 12 tablet 0    oxyCODONE (OXY IR/ROXICODONE) 5 MG immediate release tablet Take 1-2 tablets (5-10 mg total) by mouth every 4 (four) hours as needed for up to 7 days for moderate pain. 30 tablet 0    pantoprazole (PROTONIX) 40 MG tablet Take 40 tablets by mouth daily.        Physical Exam   Blood pressure 134/79, pulse 97, temperature 98 F (36.7 C), resp. rate 18, unknown if currently breastfeeding.  General appearance: alert, cooperative, and no distress Extremities:  bilateral 1+ edema. No Homan's sign. Leg appears symmetric Pulses: 2+ and symmetric ED Course  IMP: peripheral edema postoperative Morbid obesity S/P C/S P) disc  doppler  studies unavailable tonight. Disc empiric treatment with Lovenox pending testing in morning. Pt opts for  Injection. Schedule doppler venous study in am D/c home MDM   Marvene Staff, MD 9:04 PM 03/08/2021

## 2021-03-08 NOTE — MAU Note (Signed)
Post partum c-section 03/04/2021. Pt went home today. Stated she has noticed her right leg getting more swollen and tight.

## 2021-03-09 ENCOUNTER — Other Ambulatory Visit (HOSPITAL_COMMUNITY): Payer: Self-pay | Admitting: Obstetrics and Gynecology

## 2021-03-09 ENCOUNTER — Other Ambulatory Visit: Payer: Self-pay | Admitting: Obstetrics and Gynecology

## 2021-03-09 ENCOUNTER — Ambulatory Visit (HOSPITAL_COMMUNITY)
Admission: RE | Admit: 2021-03-09 | Discharge: 2021-03-09 | Disposition: A | Discharge status: Home / Self Care | Payer: BC Managed Care – PPO | Source: Ambulatory Visit | Attending: Obstetrics and Gynecology | Admitting: Obstetrics and Gynecology

## 2021-03-09 DIAGNOSIS — R609 Edema, unspecified: Secondary | ICD-10-CM | POA: Insufficient documentation

## 2021-03-09 NOTE — Progress Notes (Signed)
VASCULAR LAB    Right lower extremity venous duplex has been performed.  See CV proc for preliminary results.  Called report to Dr. Inge Rise, [facility], RVT 03/09/2021, 11:02 AM

## 2021-05-14 ENCOUNTER — Telehealth: Payer: Self-pay | Admitting: Hematology

## 2021-05-14 NOTE — Telephone Encounter (Signed)
Scheduled appts per 6/28 sch msg. Pt aware.

## 2021-06-11 NOTE — Progress Notes (Signed)
HEMATOLOGY/ONCOLOGY CLINIC NOTE  Date of Service: 06/11/2021  Patient Care Team: Girtha Rm, NP-C as PCP - General (Family Medicine)  CHIEF COMPLAINTS/PURPOSE OF CONSULTATION:  Thrombocytosis in pregnancy  HISTORY OF PRESENTING ILLNESS:  Kathryn Velazquez is a wonderful 35 y.o. female who has been referred to [place] by Harland Dingwall NP-C for evaluation and management of thrombocytosis in pregnancy. The pt reports that she is doing well overall.   The pt reports that she is seven weeks pregnant with her second child. Pt had a healthy delivery via C-section with her first child 13 years ago.    She has a long history of iron deficiency and related anemia. Her menstrual cycles last 5-6 days and are not especially heavy. Pt has tried PO Iron in the past, but had difficulty taking it consistently. She has never required a blood transfusion or any IV Iron infusions. Pt has been able to take 65 mg of iron once a day for the last two weeks. Pt denies any abdominal discomfort from PO Iron. She is also currently taking a prenatal vitamin.   She has no family history of blood disorders. Pt has no history of Polycystic ovary syndrome, Hydradenitis Suppurativa or recent infections. She has no known medication allergies. Pt has no history of blood clots.   Pt has some general fatigue, but denies any nausea or ice cravings.   Most recent lab results (07/27/2020) of CBC w/diff and CMP is as follows: all values are WNL except for WBC at 12.2, Hgb at 10.3, MCV at 71, MCH at 21.3, MCHC at 30.1, RDW at 16.4, PLT at 550K, Neutro Abs at 8.4K, Creatinine at 1.05, Albumin/Globulin Ratio at 1.1. 07/27/2020 Iron Panel is as follows: TIBC at 374, UIBC at 354, Iron at 20, Iron Sat at 5, Ferritin at 20 07/27/2020 Vitamin B12 at 742 07/27/2020 Folate at 4.2  On review of systems, pt reports fatigue and denies ice cravings, nausea joint pain, fevers, chills, night sweats, abdominal pain and any other symptoms.    On PMHx the pt reports C-section, IDA, Thrombocytosis. On Social Hx the pt reports she stopped smoking two months ago.  INTERVAL HISTORY:  Kathryn Velazquez is a wonderful 35 y.o. female who is here today for f/u of thrombocytosis. The patient's last visit with [place] was on 08/09/2020.  Thrombocytosis at the time was likely related to iron deficiency anemia and was not thought to be clonal and the patient was recommended p.o. iron with a follow-up in 3 months.  Patient however was lost to follow-up.  Patient was referred by her OB/GYN doctor Dr. Garwin Brothers for postpartum iron deficiency and thrombocytosis follow-up.  She had her delivery on 03/04/2021.  Lab results today 06/12/2021 of CBC w/diff today shows a hemoglobin of 10 with an MCV of 71.5 WBC count of 11 k and platelets of 535 k.  CMP was primarily unremarkable. Ferritin levels are low at 14 with an iron saturation of 6%  on review of systems, pt reports significant fatigue and some dyspnea on exertion.  Notes some pica symptoms.  No evidence of overt GI bleeding at this time.      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   Medication management 05/28/2020   Palpitations 01/06/2021   Pregnancy 01/06/2021   Thrombocytosis 11/10/2018    SURGICAL HISTORY: Past Surgical History:  Procedure Laterality Date   CESAREAN SECTION  CESAREAN SECTION N/A 03/04/2021   Procedure: CESAREAN SECTION;  Surgeon: Servando Salina, MD;  Location: [facility] LD [facility]S;  Service: Obstetrics;  Laterality: N/A;    SOCIAL HISTORY: Social History   Socioeconomic History   Marital status: Unknown    Spouse name: Not on file   Number of children: Not on file   Years of education: Not on file   Highest education level: Not on file  Occupational History   Not on file  Tobacco Use   Smoking status: Former    Types: Cigarettes    Quit date: 2021     Years since quitting: 1.5   Smokeless tobacco: Never   Tobacco comments:    twice a month  Vaping Use   Vaping Use: Never used  Substance and Sexual Activity   Alcohol use: Not Currently    Comment: rare   Drug use: No   Sexual activity: Not Currently    Birth control/protection: None  Other Topics Concern   Not on file  Social History Narrative   Not on file   Social Determinants of Health   Financial Resource Strain: Not on file  Food Insecurity: Not on file  Transportation Needs: Not on file  Physical Activity: Not on file  Stress: Not on file  Social Connections: Not on file  Intimate Partner Violence: Not on file    FAMILY HISTORY: Family History  Problem Relation Age of Onset   Aneurysm Mother    Alcohol abuse Mother    Cerebral aneurysm Mother    Hypertension Father    Diabetes Brother     ALLERGIES:  has No Known Allergies.  MEDICATIONS:  Current Outpatient Medications  Medication Sig Dispense Refill   albuterol (VENTOLIN HFA) 108 (90 Base) MCG/ACT inhaler Inhale 2 puffs into the lungs every 6 (six) hours as needed for wheezing or shortness of breath. 8 g 0   ibuprofen (ADVIL) 800 MG tablet Take 1 tablet (800 mg total) by mouth every 6 (six) hours as needed. 30 tablet 11   iron polysaccharides (NIFEREX) 150 MG capsule Take 1 capsule (150 mg total) by mouth 2 (two) times daily. 60 capsule 6   pantoprazole (PROTONIX) 40 MG tablet Take 40 tablets by mouth daily.     Prenatal Vit-Fe Fumarate-FA (PRENATAL MULTIVITAMIN) TABS tablet Take 1 tablet by mouth daily at 12 noon.     No current facility-administered medications for this visit.    REVIEW OF SYSTEMS:    10 Point review of Systems was done is negative except as noted above.  PHYSICAL EXAMINATION: ECOG PERFORMANCE STATUS: 0 - Asymptomatic  . Vitals:   06/12/21 1021  BP: 140/80  Pulse: 82  Resp: 17  Temp: 98.7 F (37.1 C)  SpO2: 100%    Filed Weights   06/12/21 1021  Weight: 253 lb 14.4 oz  (115.2 kg)    .Body mass index is 46.44 kg/m. No clear distress GENERAL:alert, in no acute distress and comfortable SKIN: no acute rashes, no significant lesions EYES: conjunctiva are pink and non-injected, sclera anicteric OROPHARYNX: MMM, no exudates, no oropharyngeal erythema or ulceration NECK: supple, no JVD LYMPH:  no palpable lymphadenopathy in the cervical, axillary or inguinal regions LUNGS: clear to auscultation b/l with normal respiratory effort HEART: regular rate & rhythm ABDOMEN:  normoactive bowel sounds , non tender, not distended. Extremity: no pedal edema PSYCH: alert & oriented x 3 with fluent speech NEURO: no focal motor/sensory deficits   LABORATORY DATA:  I have reviewed the data  as listed  . CBC Latest Ref Rng & Units 06/12/2021 03/05/2021 03/04/2021  WBC 4.0 - 10.5 K/uL 11.0(H) 21.2(H) 12.3(H)  Hemoglobin 12.0 - 15.0 g/dL 10.0(L) 9.3(L) 11.1(L)  Hematocrit 36.0 - 46.0 % 32.3(L) 28.4(L) 35.1(L)  Platelets 150 - 400 K/uL 535(H) 405(H) 462(H)    . CMP Latest Ref Rng & Units 06/12/2021 03/04/2021 01/04/2021  Glucose 70 - 99 mg/dL 104(H) 93 81  BUN 6 - 20 mg/dL '11 6 6  '$ Creatinine 0.44 - 1.00 mg/dL 0.76 0.69 0.47(L)  Sodium 135 - 145 mmol/L 140 135 135  Potassium 3.5 - 5.1 mmol/L 3.8 4.1 4.2  Chloride 98 - 111 mmol/L 107 109 102  CO2 22 - 32 mmol/L 25 18(L) 17(L)  Calcium 8.9 - 10.3 mg/dL 9.4 9.0 9.3  Total Protein 6.5 - 8.1 g/dL 7.7 6.0(L) 7.1  Total Bilirubin 0.3 - 1.2 mg/dL 0.4 0.4 0.2  Alkaline Phos 38 - 126 U/L 102 229(H) 158(H)  AST 15 - 41 U/L 11(L) 28 9  ALT 0 - 44 U/L '9 25 7   '$ RADIOGRAPHIC STUDIES: I have personally reviewed the radiological images as listed and agreed with the findings in the report. No results found.  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 2) iron deficiency anemia in the setting of recent pregnancy and  previous heavy periods.  PLAN: -Discussed pt labwork today, 06/12/2021; patient has moderate anemia with a hemoglobin of about 10 with an MCV of 71. She is noted to be significantly iron deficient with a ferritin of 14 and iron saturation of 6% despite have been on oral iron replacement. -She is clinically fairly symptomatic from her iron deficiency anemia. -We will discussed ongoing oral iron replacement with increasing dose versus IV iron replacement and she prefers IV iron replacement after discussing the pros and cons and potential alternatives. -We discussed using IV Venofer 300 mg weekly for 3 doses and reassessing her labs in about 3 months to reevaluate improvement in her platelet counts with iron replacement as well as for resolution of anemia and iron deficiency. -All her questions about IV iron were answered in detail and she would like to proceed. FOLLOW UP: IV Venofer weekly x 3 doses RTC with Dr Irene Limbo with labs in 3 months   All of the patients questions were answered with apparent satisfaction. The patient knows to call the clinic with any problems, questions or concerns.   The total time spent in the appointment was 20 minutes and more than 50% was on counseling and direct patient cares.    Sullivan Lone MD [place] AAHIVMS [facility] Hematology/Oncology Physician [facility]  (Office):       [phone] (Work cell):  [phone] (Fax):           [phone]  06/11/2021 9:06 PM  I, Reinaldo Raddle, am acting as scribe for Dr. Sullivan Lone, MD.   .I have reviewed the above documentation for accuracy and completeness, and I agree with the above. Brunetta Genera MD

## 2021-06-12 ENCOUNTER — Other Ambulatory Visit: Payer: Self-pay

## 2021-06-12 ENCOUNTER — Inpatient Hospital Stay: Payer: BC Managed Care – PPO | Attending: Hematology | Admitting: Hematology

## 2021-06-12 ENCOUNTER — Inpatient Hospital Stay: Payer: BC Managed Care – PPO

## 2021-06-12 VITALS — BP 140/80 | HR 82 | Temp 98.7°F | Resp 17 | Ht 62.0 in | Wt 253.9 lb

## 2021-06-12 DIAGNOSIS — D75839 Thrombocytosis, unspecified: Secondary | ICD-10-CM | POA: Diagnosis present

## 2021-06-12 DIAGNOSIS — O99111 Other diseases of the blood and blood-forming organs and certain disorders involving the immune mechanism complicating pregnancy, first trimester: Secondary | ICD-10-CM | POA: Diagnosis present

## 2021-06-12 DIAGNOSIS — D509 Iron deficiency anemia, unspecified: Secondary | ICD-10-CM | POA: Diagnosis not present

## 2021-06-12 LAB — CBC WITH DIFFERENTIAL (CANCER CENTER ONLY)
Abs Immature Granulocytes: 0.03 10*3/uL (ref 0.00–0.07)
Basophils Absolute: 0 10*3/uL (ref 0.0–0.1)
Basophils Relative: 0 %
Eosinophils Absolute: 0.2 10*3/uL (ref 0.0–0.5)
Eosinophils Relative: 2 %
HCT: 32.3 % — ABNORMAL LOW (ref 36.0–46.0)
Hemoglobin: 10 g/dL — ABNORMAL LOW (ref 12.0–15.0)
Immature Granulocytes: 0 %
Lymphocytes Relative: 18 %
Lymphs Abs: 2 10*3/uL (ref 0.7–4.0)
MCH: 22.1 pg — ABNORMAL LOW (ref 26.0–34.0)
MCHC: 31 g/dL (ref 30.0–36.0)
MCV: 71.5 fL — ABNORMAL LOW (ref 80.0–100.0)
Monocytes Absolute: 0.7 10*3/uL (ref 0.1–1.0)
Monocytes Relative: 6 %
Neutro Abs: 8.2 10*3/uL — ABNORMAL HIGH (ref 1.7–7.7)
Neutrophils Relative %: 74 %
Platelet Count: 535 10*3/uL — ABNORMAL HIGH (ref 150–400)
RBC: 4.52 MIL/uL (ref 3.87–5.11)
RDW: 16.8 % — ABNORMAL HIGH (ref 11.5–15.5)
WBC Count: 11 10*3/uL — ABNORMAL HIGH (ref 4.0–10.5)
nRBC: 0 % (ref 0.0–0.2)

## 2021-06-12 LAB — CMP (CANCER CENTER ONLY)
ALT: 9 U/L (ref 0–44)
AST: 11 U/L — ABNORMAL LOW (ref 15–41)
Albumin: 3.5 g/dL (ref 3.5–5.0)
Alkaline Phosphatase: 102 U/L (ref 38–126)
Anion gap: 8 (ref 5–15)
BUN: 11 mg/dL (ref 6–20)
CO2: 25 mmol/L (ref 22–32)
Calcium: 9.4 mg/dL (ref 8.9–10.3)
Chloride: 107 mmol/L (ref 98–111)
Creatinine: 0.76 mg/dL (ref 0.44–1.00)
GFR, Estimated: >60 mL/min (ref >60)
Glucose, Bld: 104 mg/dL — ABNORMAL HIGH (ref 70–99)
Potassium: 3.8 mmol/L (ref 3.5–5.1)
Sodium: 140 mmol/L (ref 135–145)
Total Bilirubin: 0.4 mg/dL (ref 0.3–1.2)
Total Protein: 7.7 g/dL (ref 6.5–8.1)

## 2021-06-12 LAB — IRON AND TIBC
Iron: 25 ug/dL — ABNORMAL LOW (ref 41–142)
Saturation Ratios: 6 % — ABNORMAL LOW (ref 21–57)
TIBC: 390 ug/dL (ref 236–444)
UIBC: 365 ug/dL (ref 120–384)

## 2021-06-12 LAB — FERRITIN: Ferritin: 14 ng/mL (ref 11–307)

## 2021-06-18 ENCOUNTER — Encounter: Payer: Self-pay | Admitting: Hematology

## 2021-06-24 ENCOUNTER — Inpatient Hospital Stay: Payer: BC Managed Care – PPO | Attending: Hematology

## 2021-06-24 ENCOUNTER — Other Ambulatory Visit: Payer: Self-pay

## 2021-06-24 VITALS — BP 152/73 | HR 67 | Temp 98.3°F | Resp 18

## 2021-06-24 DIAGNOSIS — D509 Iron deficiency anemia, unspecified: Secondary | ICD-10-CM | POA: Insufficient documentation

## 2021-06-24 MED ORDER — LORATADINE 10 MG PO TABS
ORAL_TABLET | ORAL | Status: AC
  Filled 2021-06-24: qty 1

## 2021-06-24 MED ORDER — SODIUM CHLORIDE 0.9 % IV SOLN
Freq: Once | INTRAVENOUS | Status: AC
  Filled 2021-06-24: qty 250

## 2021-06-24 MED ORDER — ACETAMINOPHEN 325 MG PO TABS
ORAL_TABLET | ORAL | Status: AC
  Filled 2021-06-24: qty 2

## 2021-06-24 MED ORDER — ACETAMINOPHEN 325 MG PO TABS
650.0000 mg | ORAL_TABLET | Freq: Once | ORAL | Status: AC
  Administered 2021-06-24: 650 mg via ORAL

## 2021-06-24 MED ORDER — SODIUM CHLORIDE 0.9 % IV SOLN
300.0000 mg | Freq: Once | INTRAVENOUS | Status: AC
  Administered 2021-06-24: 300 mg via INTRAVENOUS
  Filled 2021-06-24: qty 300

## 2021-06-24 MED ORDER — LORATADINE 10 MG PO TABS
10.0000 mg | ORAL_TABLET | Freq: Once | ORAL | Status: AC
  Administered 2021-06-24: 10 mg via ORAL

## 2021-06-24 NOTE — Patient Instructions (Signed)

## 2021-07-01 ENCOUNTER — Other Ambulatory Visit: Payer: Self-pay

## 2021-07-01 ENCOUNTER — Inpatient Hospital Stay: Payer: BC Managed Care – PPO

## 2021-07-01 VITALS — BP 135/64 | HR 75 | Temp 98.0°F | Resp 17

## 2021-07-01 DIAGNOSIS — D509 Iron deficiency anemia, unspecified: Secondary | ICD-10-CM

## 2021-07-01 MED ORDER — ACETAMINOPHEN 325 MG PO TABS
650.0000 mg | ORAL_TABLET | Freq: Once | ORAL | Status: AC
  Administered 2021-07-01: 650 mg via ORAL
  Filled 2021-07-01: qty 2

## 2021-07-01 MED ORDER — ACETAMINOPHEN 325 MG PO TABS
ORAL_TABLET | ORAL | Status: AC
  Filled 2021-07-01: qty 1

## 2021-07-01 MED ORDER — LORATADINE 10 MG PO TABS
10.0000 mg | ORAL_TABLET | Freq: Once | ORAL | Status: AC
  Administered 2021-07-01: 10 mg via ORAL
  Filled 2021-07-01: qty 1

## 2021-07-01 MED ORDER — SODIUM CHLORIDE 0.9 % IV SOLN
300.0000 mg | Freq: Once | INTRAVENOUS | Status: AC
  Administered 2021-07-01: 300 mg via INTRAVENOUS
  Filled 2021-07-01: qty 300

## 2021-07-01 MED ORDER — SODIUM CHLORIDE 0.9 % IV SOLN
Freq: Once | INTRAVENOUS | Status: AC
  Filled 2021-07-01: qty 250

## 2021-07-01 NOTE — Progress Notes (Signed)
Patient remained for 30 minute post Venofer observation.  Pt tolerated treatment well.  VSS at discharge. Ambulatory to lobby.

## 2021-07-01 NOTE — Patient Instructions (Signed)

## 2021-07-08 ENCOUNTER — Other Ambulatory Visit: Payer: Self-pay

## 2021-07-08 ENCOUNTER — Inpatient Hospital Stay: Payer: BC Managed Care – PPO

## 2021-07-08 VITALS — BP 129/73 | HR 69 | Temp 98.1°F | Resp 18 | Wt 260.0 lb

## 2021-07-08 DIAGNOSIS — D509 Iron deficiency anemia, unspecified: Secondary | ICD-10-CM | POA: Diagnosis not present

## 2021-07-08 MED ORDER — ACETAMINOPHEN 325 MG PO TABS
650.0000 mg | ORAL_TABLET | Freq: Once | ORAL | Status: AC
  Administered 2021-07-08: 650 mg via ORAL
  Filled 2021-07-08: qty 2

## 2021-07-08 MED ORDER — LORATADINE 10 MG PO TABS
10.0000 mg | ORAL_TABLET | Freq: Once | ORAL | Status: AC
  Administered 2021-07-08: 10 mg via ORAL
  Filled 2021-07-08: qty 1

## 2021-07-08 MED ORDER — SODIUM CHLORIDE 0.9 % IV SOLN
300.0000 mg | Freq: Once | INTRAVENOUS | Status: AC
  Administered 2021-07-08: 300 mg via INTRAVENOUS
  Filled 2021-07-08: qty 300

## 2021-07-08 MED ORDER — SODIUM CHLORIDE 0.9 % IV SOLN: Freq: Once | INTRAVENOUS | Status: AC

## 2021-07-08 NOTE — Patient Instructions (Signed)

## 2021-09-11 ENCOUNTER — Inpatient Hospital Stay: Payer: BC Managed Care – PPO | Attending: Hematology | Admitting: Hematology

## 2021-09-11 ENCOUNTER — Inpatient Hospital Stay: Payer: BC Managed Care – PPO

## 2021-09-16 ENCOUNTER — Telehealth: Payer: Self-pay | Admitting: Hematology

## 2021-09-16 NOTE — Telephone Encounter (Signed)
Called pt to reschedule per 10/27 in basket, pt said they will call back to reschedule

## 2021-11-27 ENCOUNTER — Other Ambulatory Visit (HOSPITAL_COMMUNITY): Payer: Self-pay | Admitting: General Surgery

## 2021-11-27 DIAGNOSIS — E569 Vitamin deficiency, unspecified: Secondary | ICD-10-CM

## 2021-11-27 DIAGNOSIS — R7303 Prediabetes: Secondary | ICD-10-CM

## 2021-11-28 ENCOUNTER — Encounter: Payer: Self-pay | Admitting: Hematology

## 2021-12-06 ENCOUNTER — Other Ambulatory Visit (HOSPITAL_COMMUNITY): Payer: BC Managed Care – PPO

## 2021-12-11 ENCOUNTER — Other Ambulatory Visit: Payer: Self-pay

## 2021-12-11 ENCOUNTER — Ambulatory Visit (HOSPITAL_COMMUNITY)
Admission: RE | Admit: 2021-12-11 | Discharge: 2021-12-11 | Disposition: A | Discharge status: Home / Self Care | Payer: BC Managed Care – PPO | Source: Ambulatory Visit | Attending: General Surgery | Admitting: General Surgery

## 2021-12-11 DIAGNOSIS — E569 Vitamin deficiency, unspecified: Secondary | ICD-10-CM

## 2021-12-11 DIAGNOSIS — R7303 Prediabetes: Secondary | ICD-10-CM

## 2022-01-01 ENCOUNTER — Encounter: Payer: Self-pay | Admitting: Dietician

## 2022-01-01 ENCOUNTER — Encounter: Payer: BC Managed Care – PPO | Attending: General Surgery | Admitting: Dietician

## 2022-01-01 ENCOUNTER — Other Ambulatory Visit: Payer: Self-pay

## 2022-01-01 VITALS — Ht 62.0 in | Wt 269.3 lb

## 2022-01-01 DIAGNOSIS — Z6841 Body Mass Index (BMI) 40.0 and over, adult: Secondary | ICD-10-CM

## 2022-01-01 DIAGNOSIS — R7303 Prediabetes: Secondary | ICD-10-CM | POA: Diagnosis not present

## 2022-01-01 NOTE — Progress Notes (Signed)
Nutrition Assessment for Bariatric Surgery   Appointment start time: 1100   end time: 1200  Planned Surgery: Sleeve Gastrectomy  Patient goal for bariatric surgery:  Looking for a fresh start with renewed energy for her family; has had weight loss several times that lasted only short term. She has 35 year-old and 7-month old children.  Anthropometrics: Weight: 269.3lbs Height: 5'2" BMI: 49.26    Patient's Goal Weight: 170lbs  Clinical: Medical History: pre diabetes Medications and Supplements: albuterol inhaler prn, pantoprazole prn, ibuprofen prn Relevant labs: HbA1C 5.7% 05/07/21 Notable symptoms: decreased stamina Drug allergies: none known Food allergies: none known  Lifestyle and Dietary History:  Dieting/ weight history:  Participated in [place] weight loss program including phentermine which helped short term.  Other times has personally cut calories and increased exercise to lose weight, but has also regained weight soon after.   Disordered or emotional eating history:  No eating disorder diagnosis history. Typically eats less if stress is high  Physical activity: no regular activity at this time  Dietary Recall:  Daily pattern: 2 meals and multiple snacks. Dining out: 6-10 meals per week. Breakfast: fast food 2-3 times a week; at home-- pancake and [place] sausage, sometimes skips  Snack: candy/ sweets; rarely chips Lunch: fast food when at work; skips lunch when home  Snack: same as am Supper: usually cooks at home -- salmon + rice, + asparagus; shrimp and baked potato + veg; spaghetti; 2/13 ordered out ribs + rice, corn Snack: jello; sweets Beverages: sodas; tea; juice, occasionally coffee    Nutrition Intervention: Instructed patient on pre-op diet goals and importance of close adherence to bariatric diet after surgery to avoid side effects and complications.  Discussed stages of the bariatric diet after surgery as well as the importance of adequate protein  and fluid intake.  Provided overview of 2-week pre-op diet.  Instructed on specific goals for limiting sugar and fat in foods; discussed healthy restaurant options  Discussed healthy mindset/ relationship with food and making celebrations/ special occasions about things other than food; recognizing bariatric surgery as a tool to help with weight loss and importance of maintaining healthy diet and active lifestyle to avoid weight regain.  Nutrition Diagnosis: [place]-3.3 Overweight/ obesity related to history of excess calories and inadequate physical activity as evidenced by patient with current BMI of 49.26, ready to make lifestyle changes for weight loss/ maintenance prior to bariatric surgery.  Teaching method(s) utilized: Copy provided: Pre-op Goals Diet Stage Template Visit summary with goals to be viewed via MyChart  Learning Readiness: Ready for change  Barriers to learning/ implementing lifestyle change: none  Demonstrated degree of understanding via: Teach Back  Summary: Patient voices readiness to begin making diet and lifestyle changes in effort to lose weight and prepare for bariatric surgery.  Patient's goal for weight loss is maintaining health and gaining energy to help with caring for her children. She has solid support from family and friends, she has been speaking with several friends who have undergone bariatric surgery.  She agrees to work on reducing sugar intake from foods and beverages, and gradually reduce caffeine and soda intake prior to surgery.  She voices understanding of, and is motivated to follow the bariatric diet after surgery.  From a nutrition standpoint, she is ready to proceed with the bariatric surgery program and will be a good candidate for surgery.   Plan: Patient commits to returning for RN and RD-led  pre-op class prior to surgery.  She will plan to return for post-op MNT visits beginning 2 weeks after surgery.

## 2022-01-01 NOTE — Patient Instructions (Signed)
Decrease sodas and sweet tea gradually; OK to drink sugar free flavoring in water or "diet" juices.  Have healthy snacks on hand such as fruit, low sugar yogurt, graham crackers with peanut butter and wait at least 2-3 hours after a meal before eating a snack.

## 2022-01-15 ENCOUNTER — Ambulatory Visit (INDEPENDENT_AMBULATORY_CARE_PROVIDER_SITE_OTHER): Payer: BC Managed Care – PPO | Admitting: Psychology

## 2022-01-15 DIAGNOSIS — F509 Eating disorder, unspecified: Secondary | ICD-10-CM | POA: Diagnosis not present
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
## 2022-01-22 ENCOUNTER — Telehealth: Payer: Self-pay | Admitting: Psychology

## 2022-01-22 NOTE — Telephone Encounter (Signed)
This Probation officer contacted Kathryn Velazquez's identified mental health facility to obtain information about her mental health treatment to add to her bariatric evaluation (a ROI was previously completed). The front desk noted the provider would call this writer back ?

## 2022-01-30 ENCOUNTER — Ambulatory Visit (INDEPENDENT_AMBULATORY_CARE_PROVIDER_SITE_OTHER): Payer: BC Managed Care – PPO | Admitting: Psychology

## 2022-01-30 DIAGNOSIS — F509 Eating disorder, unspecified: Secondary | ICD-10-CM
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
## 2022-03-17 ENCOUNTER — Encounter: Payer: BC Managed Care – PPO | Attending: General Surgery | Admitting: Skilled Nursing Facility1

## 2022-03-17 ENCOUNTER — Encounter: Payer: Self-pay | Admitting: Skilled Nursing Facility1

## 2022-03-17 DIAGNOSIS — E669 Obesity, unspecified: Secondary | ICD-10-CM | POA: Diagnosis present

## 2022-03-17 NOTE — Progress Notes (Signed)
Pre-Operative Nutrition Class:   ? ?Patient was seen on 03/17/2022 for Pre-Operative Bariatric Surgery Education at [facility].   ? ?Surgery date: 03/31/2022 ?Surgery type: Sleeve Gastrectomy  ?Start weight at [facility]: 269.3 ?Weight today: 274.4 ? ?Samples given per MNT protocol. Patient educated on appropriate usage: ?Bariatric Advantage Multivitamin ?Lot # [id number] ?Exp:08/24 ?  ?Bariatric Advantage Calcium  ?Lot #[id number] ?Exp: 09/22/2022 ?  ?Protein Powder ?Lot # [id number] ?Exp: 02/24 ?  ?Protein Shake ?Lot # [id number] ?Exp: [id number] ? ?The following the learning objectives were met by the patient during this course: ?Identify Pre-Op Dietary Goals and will begin 2 weeks pre-operatively ?Identify appropriate sources of fluids and proteins  ?State protein recommendations and appropriate sources pre and post-operatively ?Identify Post-Operative Dietary Goals and will follow for 2 weeks post-operatively ?Identify appropriate multivitamin and calcium sources ?Describe the need for physical activity post-operatively and will follow MD recommendations ?State when to call healthcare provider regarding medication questions or post-operative complications ?When having a diagnosis of diabetes understanding hypoglycemia symptoms and the inclusion of 1 complex carbohydrate per meal ? ?Handouts given during class include: ?Pre-Op Bariatric Surgery Diet Handout ?Protein Shake Handout ?Post-Op Bariatric Surgery Nutrition Handout ?BELT Program Information Flyer ?Support Group Information Flyer ?[facility] Bariatric Supplements Price List ? ?Follow-Up Plan: ?Patient will follow-up at [facility] 2 weeks post operatively for diet advancement per MD.  ?

## 2022-03-19 NOTE — Progress Notes (Signed)
Sent message, via epic in basket, requesting orders in epic from surgeon.  

## 2022-03-20 NOTE — Patient Instructions (Addendum)
DUE TO COVID-19 ONLY TWO VISITORS  (aged 35 and older)  IS ALLOWED TO COME WITH YOU AND STAY IN THE WAITING ROOM ONLY DURING PRE OP AND PROCEDURE.   ?**NO VISITORS ARE ALLOWED IN THE SHORT STAY AREA OR RECOVERY ROOM!!** ? ?IF YOU WILL BE ADMITTED INTO THE HOSPITAL YOU ARE ALLOWED ONLY FOUR SUPPORT PEOPLE DURING VISITATION HOURS ONLY (7 AM -8PM)   ?The support person(s) must pass our screening, gel in and out ?Visitors GUEST BADGE MUST BE WORN VISIBLY  ?One adult visitor may remain with you overnight and MUST be in the room by 8 P.M.  ? ?You are not required to quarantine ?Hand Hygiene often ?Do NOT share personal items ?Notify your provider if you are in close contact with someone who has COVID or you develop fever 100.4 or greater, new onset of sneezing, cough, sore throat, shortness of breath or body aches. ? ?     ? Your procedure is scheduled on:  03-31-22 ? ? Report to [facility] Main Entrance ? ?  Report to admitting at 8:15 AM ? ? Call this number if you have problems the morning of surgery [phone] ? ? Do not eat food :After 6:00 PM. ? ? After 6:00 PM you may have the following liquids until 7:20 AM DAY OF SURGERY ? ?Water ?Black Coffee (sugar ok, NO MILK/CREAM OR CREAMERS)  ?Tea (sugar ok, NO MILK/CREAM OR CREAMERS) regular and decaf                             ?Plain Jell-O (NO RED)                                           ?Fruit ices (not with fruit pulp, NO RED)                                     ?Popsicles (NO RED)                                                                  ?Juice: apple, WHITE grape, WHITE cranberry ?Sports drinks like Gatorade (NO RED) ?Clear broth(vegetable,chicken,beef) ? ?             ?  ?  ?The day of surgery:  ?Drink ONE (1) Pre-Surgery G2 at 7:30 AM the morning of surgery. Drink in one sitting. Do not sip.  ?This drink was given to you during your hospital  ?pre-op appointment visit. ?Nothing else to drink after completing the Pre-Surger G2. ?  ?       If you  have questions, please contact your surgeon?s office. ? ? ?FOLLOW BOWEL PREP AND ANY ADDITIONAL PRE OP INSTRUCTIONS YOU RECEIVED FROM YOUR SURGEON'S OFFICE!!! ?  ?  ?Oral Hygiene is also important to reduce your risk of infection.                                    ?Remember -  BRUSH YOUR TEETH THE MORNING OF SURGERY WITH YOUR REGULAR TOOTHPASTE ? ? Do NOT smoke after Midnight ? ? Take these medicines the morning of surgery with A SIP OF WATER: None ?                  ?           You may not have any metal on your body including hair pins, jewelry, and body piercing ? ?           Do not wear make-up, lotions, powders, perfumes or deodorant ? ?Do not wear nail polish including gel and S&S, artificial/acrylic nails, or any other type of covering on natural nails including finger and toenails. If you have artificial nails, gel coating, etc. that needs to be removed by a nail salon please have this removed prior to surgery or surgery may need to be canceled/ delayed if the surgeon/ anesthesia feels like they are unable to be safely monitored.  ? ?Do not shave  48 hours prior to surgery.  ? ? Do not bring valuables to the hospital. [facility] [place]. ? ? Contacts, dentures or bridgework may not be worn into surgery. ? ? Bring small overnight bag day of surgery. ?  ?Special Instructions: Bring a copy of your healthcare power of attorney and living will documents the day of surgery if you haven't scanned them before. ? ?Please read over the following fact sheets you were given: IF [place] [phone] ? ?[facility] - Preparing for Surgery ?Before surgery, you can play an important role.  Because skin is not sterile, your skin needs to be as free of germs as possible.  You can reduce the number of germs on your skin by washing with CHG (chlorahexidine gluconate) soap before surgery.  CHG is an antiseptic cleaner which kills germs and bonds  with the skin to continue killing germs even after washing. ?Please DO NOT use if you have an allergy to CHG or antibacterial soaps.  If your skin becomes reddened/irritated stop using the CHG and inform your nurse when you arrive at Short Stay. ?Do not shave (including legs and underarms) for at least 48 hours prior to the first CHG shower.  You may shave your face/neck. ? ?Please follow these instructions carefully: ? 1.  Shower with CHG Soap the night before surgery and the  morning of surgery. ? 2.  If you choose to wash your hair, wash your hair first as usual with your normal  shampoo. ? 3.  After you shampoo, rinse your hair and body thoroughly to remove the shampoo.                            ? 4.  Use CHG as you would any other liquid soap.  You can apply chg directly to the skin and wash.  Gently with a scrungie or clean washcloth. ? 5.  Apply the CHG Soap to your body ONLY FROM THE NECK DOWN.   Do   not use on face/ open      ?                     Wound or open sores. Avoid contact with eyes, ears mouth and   genitals (private parts).  ?  Wash face,  Genitals (private parts) with your normal soap. ?            6.  Wash thoroughly, paying special attention to the area where your    surgery  will be performed. ? 7.  Thoroughly rinse your body with warm water from the neck down. ? 8.  DO NOT shower/wash with your normal soap after using and rinsing off the CHG Soap. ?               9.  Pat yourself dry with a clean towel. ?           10.  Wear clean pajamas. ?           11.  Place clean sheets on your bed the night of your first shower and do not  sleep with pets. ?Day of Surgery : ?Do not apply any lotions/deodorants the morning of surgery.  Please wear clean clothes to the hospital/surgery center. ? ?FAILURE TO FOLLOW THESE INSTRUCTIONS MAY RESULT IN THE CANCELLATION OF YOUR SURGERY ? ?PATIENT SIGNATURE_________________________________ ? ?NURSE  SIGNATURE__________________________________ ? ?________________________________________________________________________  ?  ?

## 2022-03-25 ENCOUNTER — Encounter (HOSPITAL_COMMUNITY): Payer: Self-pay

## 2022-03-25 NOTE — Progress Notes (Signed)
Surgery orders requested with Wendy at Dr. Kinsinger's office. 

## 2022-03-25 NOTE — Progress Notes (Addendum)
COVID Vaccine Completed:  No ?Date COVID Vaccine completed: ?Has received booster: ?COVID vaccine manufacturer: [employer]  ? ?Date of COVID positive in last 90 days:  No ? ?PCP - Harland Dingwall, NP-C ?Cardiologist - Skeet Latch, MD ? ?Chest x-ray - 12-12-21 Epic ?EKG - 03-26-22 Epic ?Stress Test - N/A ?ECHO - 01-29-21 Epic ?Cardiac Cath - N/A ?Pacemaker/ICD device last checked: ?Spinal Cord Stimulator: ? ?Bowel Prep - N/A ? ?Sleep Study - N/A ?CPAP -  ? ?Prediabetes ?Fasting Blood Sugar -  ?Checks Blood Sugar - does not check  ? ?Blood Thinner Instructions:  N/A ?Aspirin Instructions: ?Last Dose: ? ?Activity level:   Can go up a flight of stairs and perform activities of daily living without stopping and without symptoms of chest pain or shortness of breath.  Able to exercise without symptoms ? ?Anesthesia review: Tachycardia and palpitations evaluated by cardiology.  Patient states that palpitations resolved after childbirth a year ago ? ?Patient denies shortness of breath, fever, cough and chest pain at PAT appointment ? ?Patient verbalized understanding of instructions that were given to them at the PAT appointment. Patient was also instructed that they will need to review over the PAT instructions again at home before surgery.  ?

## 2022-03-26 ENCOUNTER — Encounter (HOSPITAL_COMMUNITY)
Admission: RE | Admit: 2022-03-26 | Discharge: 2022-03-26 | Disposition: A | Discharge status: Home / Self Care | Payer: BC Managed Care – PPO | Source: Ambulatory Visit | Attending: General Surgery | Admitting: General Surgery

## 2022-03-26 ENCOUNTER — Other Ambulatory Visit: Payer: Self-pay

## 2022-03-26 ENCOUNTER — Encounter (HOSPITAL_COMMUNITY): Payer: Self-pay | Admitting: *Deleted

## 2022-03-26 VITALS — BP 149/93 | HR 73 | Temp 98.2°F | Resp 20 | Ht 62.0 in | Wt 262.0 lb

## 2022-03-26 DIAGNOSIS — Z01818 Encounter for other preprocedural examination: Secondary | ICD-10-CM | POA: Insufficient documentation

## 2022-03-26 DIAGNOSIS — D649 Anemia, unspecified: Secondary | ICD-10-CM | POA: Diagnosis not present

## 2022-03-26 DIAGNOSIS — I2510 Atherosclerotic heart disease of native coronary artery without angina pectoris: Secondary | ICD-10-CM | POA: Diagnosis not present

## 2022-03-26 HISTORY — DX: Prediabetes: R73.03

## 2022-03-26 HISTORY — DX: Tachycardia, unspecified: R00.0

## 2022-03-26 HISTORY — DX: Gestational diabetes mellitus in pregnancy, unspecified control: O24.419

## 2022-03-26 LAB — BASIC METABOLIC PANEL
Anion gap: 6 (ref 5–15)
BUN: 17 mg/dL (ref 6–20)
CO2: 24 mmol/L (ref 22–32)
Calcium: 9.2 mg/dL (ref 8.9–10.3)
Chloride: 109 mmol/L (ref 98–111)
Creatinine, Ser: 0.68 mg/dL (ref 0.44–1.00)
GFR, Estimated: >60 mL/min (ref >60)
Glucose, Bld: 91 mg/dL (ref 70–99)
Potassium: 3.8 mmol/L (ref 3.5–5.1)
Sodium: 139 mmol/L (ref 135–145)

## 2022-03-26 LAB — CBC
HCT: 38.4 % (ref 36.0–46.0)
Hemoglobin: 12.8 g/dL (ref 12.0–15.0)
MCH: 26.3 pg (ref 26.0–34.0)
MCHC: 33.3 g/dL (ref 30.0–36.0)
MCV: 78.9 fL — ABNORMAL LOW (ref 80.0–100.0)
Platelets: 483 10*3/uL — ABNORMAL HIGH (ref 150–400)
RBC: 4.87 MIL/uL (ref 3.87–5.11)
RDW: 15.4 % (ref 11.5–15.5)
WBC: 8.7 10*3/uL (ref 4.0–10.5)
nRBC: 0 % (ref 0.0–0.2)

## 2022-03-26 LAB — GLUCOSE, CAPILLARY: Glucose-Capillary: 95 mg/dL (ref 70–99)

## 2022-03-26 NOTE — Patient Instructions (Signed)
DUE TO COVID-19 ONLY TWO VISITORS  (aged 35 and older)  IS ALLOWED TO COME WITH YOU AND STAY IN THE WAITING ROOM ONLY DURING PRE OP AND PROCEDURE.   ?**NO VISITORS ARE ALLOWED IN THE SHORT STAY AREA OR RECOVERY ROOM!!** ? ?IF YOU WILL BE ADMITTED INTO THE HOSPITAL YOU ARE ALLOWED ONLY FOUR SUPPORT PEOPLE DURING VISITATION HOURS ONLY (7 AM -8PM)   ?The support person(s) must pass our screening, gel in and out ?Visitors GUEST BADGE MUST BE WORN VISIBLY  ?One adult visitor may remain with you overnight and MUST be in the room by 8 P.M.  ? ?You are not required to quarantine ?Hand Hygiene often ?Do NOT share personal items ?Notify your provider if you are in close contact with someone who has COVID or you develop fever 100.4 or greater, new onset of sneezing, cough, sore throat, shortness of breath or body aches. ? ?     ? Your procedure is scheduled on:  03-31-22 ? ? Report to [facility] Main Entrance ? ?  Report to admitting at  8:15 AM ? ? Call this number if you have problems the morning of surgery [phone] ? ? Do not eat food :After 6:00 PM. ? ? From 6:00 PM you may have the following liquids until 7:30 AM DAY OF SURGERY ? ?Water ?Black Coffee (sugar ok, NO MILK/CREAM OR CREAMERS)  ?Tea (sugar ok, NO MILK/CREAM OR CREAMERS) regular and decaf                             ?Plain Jell-O (NO RED)                                           ?Fruit ices (not with fruit pulp, NO RED)                                     ?Popsicles (NO RED)                                                                  ?Juice: apple, WHITE grape, WHITE cranberry ?Sports drinks like Gatorade (NO RED) ?Clear broth(vegetable,chicken,beef) ? ?             ?  ?  ?The day of surgery:  ?Drink ONE (1) Pre-Surgery G2 at 7:30 AM the morning of surgery. Drink in one sitting. Do not sip.  ?This drink was given to you during your hospital  ?pre-op appointment visit. ?Nothing else to drink after completing the Pre-Surgery G2. ?  ?       If  you have questions, please contact your surgeon?s office. ? ? ?FOLLOW BOWEL PREP AND ANY ADDITIONAL PRE OP INSTRUCTIONS YOU RECEIVED FROM YOUR SURGEON'S OFFICE!!! ?  ?  ?Oral Hygiene is also important to reduce your risk of infection.                                    ?  Remember - BRUSH YOUR TEETH THE MORNING OF SURGERY WITH YOUR REGULAR TOOTHPASTE ? ? Do NOT smoke after Midnight ? ? Take these medicines the morning of surgery with A SIP OF WATER: None ?                  ?           You may not have any metal on your body including hair pins, jewelry, and body piercing ? ?           Do not wear make-up, lotions, powders, perfumes or deodorant ? ?Do not wear nail polish including gel and S&S, artificial/acrylic nails, or any other type of covering on natural nails including finger and toenails. If you have artificial nails, gel coating, etc. that needs to be removed by a nail salon please have this removed prior to surgery or surgery may need to be canceled/ delayed if the surgeon/ anesthesia feels like they are unable to be safely monitored.  ? ?Do not shave  48 hours prior to surgery.  ? ? Do not bring valuables to the hospital. [place]. ? ? Contacts, dentures or bridgework may not be worn into surgery. ? ? Bring small overnight bag day of surgery. ? ?Please read over the following fact sheets you were given: IF [facility]  ? ?[place] - Preparing for Surgery ?Before surgery, you can play an important role.  Because skin is not sterile, your skin needs to be as free of germs as possible.  You can reduce the number of germs on your skin by washing with CHG (chlorahexidine gluconate) soap before surgery.  CHG is an antiseptic cleaner which kills germs and bonds with the skin to continue killing germs even after washing. ?Please DO NOT use if you have an allergy to CHG or antibacterial soaps.  If your skin  becomes reddened/irritated stop using the CHG and inform your nurse when you arrive at Short Stay. ?Do not shave (including legs and underarms) for at least 48 hours prior to the first CHG shower.  You may shave your face/neck. ? ?Please follow these instructions carefully: ? 1.  Shower with CHG Soap the night before surgery and the  morning of surgery. ? 2.  If you choose to wash your hair, wash your hair first as usual with your normal  shampoo. ? 3.  After you shampoo, rinse your hair and body thoroughly to remove the shampoo.                            ? 4.  Use CHG as you would any other liquid soap.  You can apply chg directly to the skin and wash.  Gently with a scrungie or clean washcloth. ? 5.  Apply the CHG Soap to your body ONLY FROM THE NECK DOWN.   Do   not use on face/ open      ?                     Wound or open sores. Avoid contact with eyes, ears mouth and   genitals (private parts).  ?                     Production manager,  Genitals (private parts) with your normal soap. ?  6.  Wash thoroughly, paying special attention to the area where your    surgery  will be performed. ? 7.  Thoroughly rinse your body with warm water from the neck down. ? 8.  DO NOT shower/wash with your normal soap after using and rinsing off the CHG Soap. ?               9.  Pat yourself dry with a clean towel. ?           10.  Wear clean pajamas. ?           11.  Place clean sheets on your bed the night of your first shower and do not  sleep with pets. ?Day of Surgery : ?Do not apply any lotions/deodorants the morning of surgery.  Please wear clean clothes to the hospital/surgery center. ? ?FAILURE TO FOLLOW THESE INSTRUCTIONS MAY RESULT IN THE CANCELLATION OF YOUR SURGERY ? ?PATIENT SIGNATURE_________________________________ ? ?NURSE SIGNATURE__________________________________ ? ?________________________________________________________________________  ? ?Incentive Spirometer ? ?An incentive spirometer is a tool that can  help keep your lungs clear and active. This tool measures how well you are filling your lungs with each breath. Taking long deep breaths may help reverse or decrease the chance of developing breathing (pulmonary) problems (especially infection) following: ?A long period of time when you are unable to move or be active. ?BEFORE THE PROCEDURE  ?If the spirometer includes an indicator to show your best effort, your nurse or respiratory therapist will set it to a desired goal. ?If possible, sit up straight or lean slightly forward. Try not to slouch. ?Hold the incentive spirometer in an upright position. ?INSTRUCTIONS FOR USE  ?Sit on the edge of your bed if possible, or sit up as far as you can in bed or on a chair. ?Hold the incentive spirometer in an upright position. ?Breathe out normally. ?Place the mouthpiece in your mouth and seal your lips tightly around it. ?Breathe in slowly and as deeply as possible, raising the piston or the ball toward the top of the column. ?Hold your breath for 3-5 seconds or for as long as possible. Allow the piston or ball to fall to the bottom of the column. ?Remove the mouthpiece from your mouth and breathe out normally. ?Rest for a few seconds and repeat Steps 1 through 7 at least 10 times every 1-2 hours when you are awake. Take your time and take a few normal breaths between deep breaths. ?The spirometer may include an indicator to show your best effort. Use the indicator as a goal to work toward during each repetition. ?After each set of 10 deep breaths, practice coughing to be sure your lungs are clear. If you have an incision (the cut made at the time of surgery), support your incision when coughing by placing a pillow or rolled up towels firmly against it. ?Once you are able to get out of bed, walk around indoors and cough well. You may stop using the incentive spirometer when instructed by your caregiver.  ?RISKS AND COMPLICATIONS ?Take your time so you do not get dizzy or  light-headed. ?If you are in pain, you may need to take or ask for pain medication before doing incentive spirometry. It is harder to take a deep breath if you are having pain. ?AFTER USE ?Rest and breathe

## 2022-03-28 ENCOUNTER — Ambulatory Visit: Payer: Self-pay | Admitting: General Surgery

## 2022-03-31 ENCOUNTER — Inpatient Hospital Stay (HOSPITAL_COMMUNITY): Payer: BC Managed Care – PPO | Admitting: Certified Registered Nurse Anesthetist

## 2022-03-31 ENCOUNTER — Other Ambulatory Visit: Payer: Self-pay

## 2022-03-31 ENCOUNTER — Inpatient Hospital Stay (HOSPITAL_COMMUNITY)
Admission: RE | Admit: 2022-03-31 | Discharge: 2022-04-01 | DRG: 621 | Disposition: A | Discharge status: Home / Self Care | Payer: BC Managed Care – PPO | Attending: General Surgery | Admitting: General Surgery

## 2022-03-31 ENCOUNTER — Encounter (HOSPITAL_COMMUNITY): Payer: Self-pay | Admitting: General Surgery

## 2022-03-31 ENCOUNTER — Inpatient Hospital Stay (HOSPITAL_COMMUNITY): Payer: BC Managed Care – PPO | Admitting: Physician Assistant

## 2022-03-31 ENCOUNTER — Encounter (HOSPITAL_COMMUNITY)
Admission: RE | Disposition: A | Discharge status: Home / Self Care | Payer: Self-pay | Source: Home / Self Care | Attending: General Surgery

## 2022-03-31 DIAGNOSIS — Z87891 Personal history of nicotine dependence: Secondary | ICD-10-CM

## 2022-03-31 DIAGNOSIS — Z6841 Body Mass Index (BMI) 40.0 and over, adult: Secondary | ICD-10-CM

## 2022-03-31 DIAGNOSIS — Z01818 Encounter for other preprocedural examination: Secondary | ICD-10-CM

## 2022-03-31 DIAGNOSIS — Z8249 Family history of ischemic heart disease and other diseases of the circulatory system: Secondary | ICD-10-CM | POA: Diagnosis not present

## 2022-03-31 DIAGNOSIS — I10 Essential (primary) hypertension: Secondary | ICD-10-CM | POA: Diagnosis present

## 2022-03-31 DIAGNOSIS — R7303 Prediabetes: Secondary | ICD-10-CM | POA: Diagnosis present

## 2022-03-31 DIAGNOSIS — Z833 Family history of diabetes mellitus: Secondary | ICD-10-CM

## 2022-03-31 HISTORY — PX: UPPER GI ENDOSCOPY: SHX6162

## 2022-03-31 LAB — COMPREHENSIVE METABOLIC PANEL
ALT: 26 U/L (ref 0–44)
AST: 23 U/L (ref 15–41)
Albumin: 4.1 g/dL (ref 3.5–5.0)
Alkaline Phosphatase: 95 U/L (ref 38–126)
Anion gap: 8 (ref 5–15)
BUN: 14 mg/dL (ref 6–20)
CO2: 24 mmol/L (ref 22–32)
Calcium: 9 mg/dL (ref 8.9–10.3)
Chloride: 105 mmol/L (ref 98–111)
Creatinine, Ser: 0.72 mg/dL (ref 0.44–1.00)
GFR, Estimated: >60 mL/min (ref >60)
Glucose, Bld: 90 mg/dL (ref 70–99)
Potassium: 4 mmol/L (ref 3.5–5.1)
Sodium: 137 mmol/L (ref 135–145)
Total Bilirubin: 0.7 mg/dL (ref 0.3–1.2)
Total Protein: 8.3 g/dL — ABNORMAL HIGH (ref 6.5–8.1)

## 2022-03-31 LAB — CBC
HCT: 39.6 % (ref 36.0–46.0)
Hemoglobin: 12.3 g/dL (ref 12.0–15.0)
MCH: 25.1 pg — ABNORMAL LOW (ref 26.0–34.0)
MCHC: 31.1 g/dL (ref 30.0–36.0)
MCV: 80.7 fL (ref 80.0–100.0)
Platelets: 441 10*3/uL — ABNORMAL HIGH (ref 150–400)
RBC: 4.91 MIL/uL (ref 3.87–5.11)
RDW: 15.7 % — ABNORMAL HIGH (ref 11.5–15.5)
WBC: 16.7 10*3/uL — ABNORMAL HIGH (ref 4.0–10.5)
nRBC: 0 % (ref 0.0–0.2)

## 2022-03-31 LAB — TYPE AND SCREEN
ABO/RH(D): A POS
Antibody Screen: NEGATIVE

## 2022-03-31 LAB — CREATININE, SERUM
Creatinine, Ser: 0.81 mg/dL (ref 0.44–1.00)
GFR, Estimated: >60 mL/min (ref >60)

## 2022-03-31 LAB — PREGNANCY, URINE: Preg Test, Ur: NEGATIVE

## 2022-03-31 LAB — GLUCOSE, CAPILLARY: Glucose-Capillary: 89 mg/dL (ref 70–99)

## 2022-03-31 SURGERY — XI ROBOTIC GASTRIC SLEEVE RESECTION
Anesthesia: General

## 2022-03-31 MED ORDER — BUPIVACAINE-EPINEPHRINE (PF) 0.25% -1:200000 IJ SOLN
INTRAMUSCULAR | Status: AC
  Filled 2022-03-31: qty 30

## 2022-03-31 MED ORDER — FENTANYL CITRATE PF 50 MCG/ML IJ SOSY
PREFILLED_SYRINGE | INTRAMUSCULAR | Status: AC
Start: 2022-03-31 — End: 2022-03-31
  Administered 2022-03-31: 50 ug via INTRAVENOUS
  Filled 2022-03-31: qty 3

## 2022-03-31 MED ORDER — DEXAMETHASONE SODIUM PHOSPHATE 10 MG/ML IJ SOLN
INTRAMUSCULAR | Status: DC | PRN
  Administered 2022-03-31: 6 mg via INTRAVENOUS

## 2022-03-31 MED ORDER — ONDANSETRON HCL 4 MG/2ML IJ SOLN
4.0000 mg | INTRAMUSCULAR | Status: DC | PRN
  Administered 2022-03-31 – 2022-04-01 (×2): 4 mg via INTRAVENOUS
  Filled 2022-03-31 (×2): qty 2

## 2022-03-31 MED ORDER — LIDOCAINE 2% (20 MG/ML) 5 ML SYRINGE
INTRAMUSCULAR | Status: DC | PRN
  Administered 2022-03-31: 80 mg via INTRAVENOUS

## 2022-03-31 MED ORDER — KETAMINE HCL 10 MG/ML IJ SOLN
INTRAMUSCULAR | Status: DC | PRN
  Administered 2022-03-31: 50 mg via INTRAVENOUS

## 2022-03-31 MED ORDER — FENTANYL CITRATE (PF) 250 MCG/5ML IJ SOLN
INTRAMUSCULAR | Status: AC
  Filled 2022-03-31: qty 5

## 2022-03-31 MED ORDER — SUGAMMADEX SODIUM 500 MG/5ML IV SOLN
INTRAVENOUS | Status: AC
  Filled 2022-03-31: qty 5

## 2022-03-31 MED ORDER — CHLORHEXIDINE GLUCONATE CLOTH 2 % EX PADS: 6.0000 | MEDICATED_PAD | Freq: Once | CUTANEOUS | Status: DC

## 2022-03-31 MED ORDER — ACETAMINOPHEN 500 MG PO TABS
1000.0000 mg | ORAL_TABLET | Freq: Three times a day (TID) | ORAL | Status: DC
  Administered 2022-03-31 – 2022-04-01 (×4): 1000 mg via ORAL
  Filled 2022-03-31 (×4): qty 2

## 2022-03-31 MED ORDER — HYDRALAZINE HCL 20 MG/ML IJ SOLN: 10.0000 mg | INTRAMUSCULAR | Status: DC | PRN

## 2022-03-31 MED ORDER — PROPOFOL 10 MG/ML IV BOLUS
INTRAVENOUS | Status: DC | PRN
  Administered 2022-03-31: 200 mg via INTRAVENOUS

## 2022-03-31 MED ORDER — ONDANSETRON HCL 4 MG/2ML IJ SOLN
INTRAMUSCULAR | Status: DC | PRN
Start: 2022-03-31 — End: 2022-03-31
  Administered 2022-03-31: 4 mg via INTRAVENOUS

## 2022-03-31 MED ORDER — LIDOCAINE 2% (20 MG/ML) 5 ML SYRINGE
INTRAMUSCULAR | Status: DC | PRN
  Administered 2022-03-31: 1.5 mg/kg/h via INTRAVENOUS

## 2022-03-31 MED ORDER — LACTATED RINGERS IR SOLN
Status: DC | PRN
  Administered 2022-03-31: 1000 mL

## 2022-03-31 MED ORDER — AMISULPRIDE (ANTIEMETIC) 5 MG/2ML IV SOLN: 10.0000 mg | Freq: Once | INTRAVENOUS | Status: DC | PRN

## 2022-03-31 MED ORDER — MORPHINE SULFATE (PF) 2 MG/ML IV SOLN
INTRAVENOUS | Status: AC
  Filled 2022-03-31: qty 1

## 2022-03-31 MED ORDER — ROCURONIUM BROMIDE 10 MG/ML (PF) SYRINGE
PREFILLED_SYRINGE | INTRAVENOUS | Status: AC
  Filled 2022-03-31: qty 10

## 2022-03-31 MED ORDER — ACETAMINOPHEN 160 MG/5ML PO SOLN: 1000.0000 mg | Freq: Three times a day (TID) | ORAL | Status: DC

## 2022-03-31 MED ORDER — OXYCODONE HCL 5 MG/5ML PO SOLN
5.0000 mg | Freq: Four times a day (QID) | ORAL | Status: DC | PRN
  Administered 2022-03-31 – 2022-04-01 (×2): 5 mg via ORAL
  Filled 2022-03-31: qty 5

## 2022-03-31 MED ORDER — OXYCODONE HCL 5 MG/5ML PO SOLN
ORAL | Status: AC
  Filled 2022-03-31: qty 5

## 2022-03-31 MED ORDER — BUPIVACAINE LIPOSOME 1.3 % IJ SUSP
INTRAMUSCULAR | Status: DC | PRN
  Administered 2022-03-31: 20 mL

## 2022-03-31 MED ORDER — APREPITANT 40 MG PO CAPS
40.0000 mg | ORAL_CAPSULE | ORAL | Status: AC
  Administered 2022-03-31: 40 mg via ORAL
  Filled 2022-03-31: qty 1

## 2022-03-31 MED ORDER — SIMETHICONE 80 MG PO CHEW: 80.0000 mg | CHEWABLE_TABLET | Freq: Four times a day (QID) | ORAL | Status: DC | PRN

## 2022-03-31 MED ORDER — SCOPOLAMINE 1 MG/3DAYS TD PT72
1.0000 | MEDICATED_PATCH | TRANSDERMAL | Status: DC
  Administered 2022-03-31: 1.5 mg via TRANSDERMAL
  Filled 2022-03-31: qty 1

## 2022-03-31 MED ORDER — ENOXAPARIN SODIUM 30 MG/0.3ML IJ SOSY
30.0000 mg | PREFILLED_SYRINGE | Freq: Two times a day (BID) | INTRAMUSCULAR | Status: DC
  Administered 2022-03-31 – 2022-04-01 (×2): 30 mg via SUBCUTANEOUS
  Filled 2022-03-31 (×2): qty 0.3

## 2022-03-31 MED ORDER — ACETAMINOPHEN 500 MG PO TABS
1000.0000 mg | ORAL_TABLET | ORAL | Status: AC
  Administered 2022-03-31: 1000 mg via ORAL
  Filled 2022-03-31: qty 2

## 2022-03-31 MED ORDER — HEPARIN SODIUM (PORCINE) 5000 UNIT/ML IJ SOLN
5000.0000 [IU] | INTRAMUSCULAR | Status: AC
  Administered 2022-03-31: 5000 [IU] via SUBCUTANEOUS
  Filled 2022-03-31: qty 1

## 2022-03-31 MED ORDER — LACTATED RINGERS IV SOLN: INTRAVENOUS | Status: DC

## 2022-03-31 MED ORDER — 0.9 % SODIUM CHLORIDE (POUR BTL) OPTIME
TOPICAL | Status: DC | PRN
  Administered 2022-03-31: 1000 mL

## 2022-03-31 MED ORDER — KETAMINE HCL 50 MG/5ML IJ SOSY
PREFILLED_SYRINGE | INTRAMUSCULAR | Status: AC
  Filled 2022-03-31: qty 5

## 2022-03-31 MED ORDER — BUPIVACAINE LIPOSOME 1.3 % IJ SUSP
INTRAMUSCULAR | Status: AC
  Filled 2022-03-31: qty 20

## 2022-03-31 MED ORDER — LIDOCAINE HCL 2 % IJ SOLN
INTRAMUSCULAR | Status: AC
  Filled 2022-03-31: qty 20

## 2022-03-31 MED ORDER — ENSURE MAX PROTEIN PO LIQD
2.0000 [oz_av] | ORAL | Status: DC
  Administered 2022-04-01 (×5): 2 [oz_av] via ORAL

## 2022-03-31 MED ORDER — LIDOCAINE HCL (PF) 2 % IJ SOLN
INTRAMUSCULAR | Status: AC
  Filled 2022-03-31: qty 5

## 2022-03-31 MED ORDER — DEXAMETHASONE SODIUM PHOSPHATE 10 MG/ML IJ SOLN
INTRAMUSCULAR | Status: AC
  Filled 2022-03-31: qty 1

## 2022-03-31 MED ORDER — MIDAZOLAM HCL 2 MG/2ML IJ SOLN
INTRAMUSCULAR | Status: AC
  Filled 2022-03-31: qty 2

## 2022-03-31 MED ORDER — DEXTROSE-NACL 5-0.45 % IV SOLN: INTRAVENOUS | Status: DC

## 2022-03-31 MED ORDER — ESMOLOL HCL 100 MG/10ML IV SOLN
INTRAVENOUS | Status: AC
  Filled 2022-03-31: qty 10

## 2022-03-31 MED ORDER — MORPHINE SULFATE (PF) 2 MG/ML IV SOLN
1.0000 mg | INTRAVENOUS | Status: DC | PRN
  Administered 2022-03-31: 2 mg via INTRAVENOUS

## 2022-03-31 MED ORDER — SODIUM CHLORIDE 0.9 % IV SOLN
2.0000 g | INTRAVENOUS | Status: AC
  Administered 2022-03-31: 2 g via INTRAVENOUS
  Filled 2022-03-31: qty 2

## 2022-03-31 MED ORDER — CHLORHEXIDINE GLUCONATE 0.12 % MT SOLN
15.0000 mL | Freq: Once | OROMUCOSAL | Status: AC
  Administered 2022-03-31: 15 mL via OROMUCOSAL

## 2022-03-31 MED ORDER — FENTANYL CITRATE (PF) 250 MCG/5ML IJ SOLN
INTRAMUSCULAR | Status: DC | PRN
  Administered 2022-03-31: 150 ug via INTRAVENOUS
  Administered 2022-03-31 (×2): 50 ug via INTRAVENOUS

## 2022-03-31 MED ORDER — BUPIVACAINE LIPOSOME 1.3 % IJ SUSP: 20.0000 mL | Freq: Once | INTRAMUSCULAR | Status: DC

## 2022-03-31 MED ORDER — PROPOFOL 10 MG/ML IV BOLUS
INTRAVENOUS | Status: AC
  Filled 2022-03-31: qty 20

## 2022-03-31 MED ORDER — MIDAZOLAM HCL 5 MG/5ML IJ SOLN
INTRAMUSCULAR | Status: DC | PRN
  Administered 2022-03-31: 2 mg via INTRAVENOUS

## 2022-03-31 MED ORDER — BUPIVACAINE-EPINEPHRINE 0.25% -1:200000 IJ SOLN
INTRAMUSCULAR | Status: DC | PRN
Start: 2022-03-31 — End: 2022-03-31
  Administered 2022-03-31: 30 mL

## 2022-03-31 MED ORDER — ORAL CARE MOUTH RINSE: 15.0000 mL | Freq: Once | OROMUCOSAL | Status: AC

## 2022-03-31 MED ORDER — FENTANYL CITRATE PF 50 MCG/ML IJ SOSY
25.0000 ug | PREFILLED_SYRINGE | INTRAMUSCULAR | Status: DC | PRN
  Administered 2022-03-31 (×2): 50 ug via INTRAVENOUS

## 2022-03-31 MED ORDER — FAMOTIDINE IN NACL 20-0.9 MG/50ML-% IV SOLN
20.0000 mg | Freq: Two times a day (BID) | INTRAVENOUS | Status: DC
  Administered 2022-03-31 – 2022-04-01 (×3): 20 mg via INTRAVENOUS
  Filled 2022-03-31 (×3): qty 50

## 2022-03-31 MED ORDER — ESMOLOL HCL 100 MG/10ML IV SOLN
INTRAVENOUS | Status: DC | PRN
  Administered 2022-03-31: 30 mg via INTRAVENOUS
  Administered 2022-03-31: 20 mg via INTRAVENOUS

## 2022-03-31 MED ORDER — ONDANSETRON HCL 4 MG/2ML IJ SOLN
INTRAMUSCULAR | Status: AC
  Filled 2022-03-31: qty 2

## 2022-03-31 MED ORDER — DEXAMETHASONE SODIUM PHOSPHATE 4 MG/ML IJ SOLN: 4.0000 mg | INTRAMUSCULAR | Status: DC

## 2022-03-31 MED ORDER — SUGAMMADEX SODIUM 200 MG/2ML IV SOLN
INTRAVENOUS | Status: DC | PRN
  Administered 2022-03-31: 350 mg via INTRAVENOUS

## 2022-03-31 MED ORDER — ROCURONIUM BROMIDE 10 MG/ML (PF) SYRINGE
PREFILLED_SYRINGE | INTRAVENOUS | Status: DC | PRN
  Administered 2022-03-31: 60 mg via INTRAVENOUS
  Administered 2022-03-31: 20 mg via INTRAVENOUS

## 2022-03-31 SURGICAL SUPPLY — 90 items
ADH SKN CLS APL DERMABOND .7 (GAUZE/BANDAGES/DRESSINGS)
APL PRP STRL LF DISP 70% ISPRP (MISCELLANEOUS) ×1
APL SKNCLS STERI-STRIP NONHPOA (GAUZE/BANDAGES/DRESSINGS) ×1
APPLIER CLIP 5 13 M/L LIGAMAX5 (MISCELLANEOUS)
APPLIER CLIP ROT 10 11.4 M/L (STAPLE)
APR CLP MED LRG 11.4X10 (STAPLE)
APR CLP MED LRG 5 ANG JAW (MISCELLANEOUS)
BAG LAPAROSCOPIC 12 15 PORT 16 (BASKET) ×1 IMPLANT
BAG RETRIEVAL 12/15 (BASKET) ×2
BENZOIN TINCTURE PRP APPL 2/3 (GAUZE/BANDAGES/DRESSINGS) ×2 IMPLANT
BLADE SURG SZ11 CARB STEEL (BLADE) ×2 IMPLANT
BNDG ADH 1X3 SHEER STRL LF (GAUZE/BANDAGES/DRESSINGS) ×12 IMPLANT
BNDG ADH THN 3X1 STRL LF (GAUZE/BANDAGES/DRESSINGS) ×6
CANNULA REDUC XI 12-8 STAPL (CANNULA) ×2
CANNULA REDUCER 12-8 DVNC XI (CANNULA) ×1 IMPLANT
CHLORAPREP W/TINT 26 (MISCELLANEOUS) ×2 IMPLANT
CLIP APPLIE 5 13 M/L LIGAMAX5 (MISCELLANEOUS) IMPLANT
CLIP APPLIE ROT 10 11.4 M/L (STAPLE) IMPLANT
CLSR STERI-STRIP ANTIMIC 1/2X4 (GAUZE/BANDAGES/DRESSINGS) ×2 IMPLANT
COVER SURGICAL LIGHT HANDLE (MISCELLANEOUS) ×2 IMPLANT
DERMABOND ADVANCED (GAUZE/BANDAGES/DRESSINGS)
DERMABOND ADVANCED .7 DNX12 (GAUZE/BANDAGES/DRESSINGS) IMPLANT
DRAPE ARM DVNC X/XI (DISPOSABLE) ×4 IMPLANT
DRAPE COLUMN DVNC XI (DISPOSABLE) ×1 IMPLANT
DRAPE CV SPLIT W-CLR ANES SCRN (DRAPES) ×2 IMPLANT
DRAPE DA VINCI XI ARM (DISPOSABLE) ×8
DRAPE DA VINCI XI COLUMN (DISPOSABLE) ×2
DRAPE ORTHO SPLIT 77X108 STRL (DRAPES) ×2
DRAPE SURG ORHT 6 SPLT 77X108 (DRAPES) ×1 IMPLANT
ELECT REM PT RETURN 15FT ADLT (MISCELLANEOUS) ×2 IMPLANT
ENDOLOOP SUT PDS II  0 18 (SUTURE)
ENDOLOOP SUT PDS II 0 18 (SUTURE) IMPLANT
GAUZE 4X4 16PLY ~~LOC~~+RFID DBL (SPONGE) ×2 IMPLANT
GLOVE BIOGEL PI IND STRL 7.0 (GLOVE) ×2 IMPLANT
GLOVE BIOGEL PI INDICATOR 7.0 (GLOVE) ×2
GLOVE SURG SS PI 7.0 STRL IVOR (GLOVE) ×4 IMPLANT
GOWN STRL REUS W/ TWL LRG LVL3 (GOWN DISPOSABLE) ×2 IMPLANT
GOWN STRL REUS W/ TWL XL LVL3 (GOWN DISPOSABLE) IMPLANT
GOWN STRL REUS W/TWL LRG LVL3 (GOWN DISPOSABLE) ×4
GOWN STRL REUS W/TWL XL LVL3 (GOWN DISPOSABLE)
GRASPER SUT TROCAR 14GX15 (MISCELLANEOUS) ×2 IMPLANT
IRRIG SUCT STRYKERFLOW 2 WTIP (MISCELLANEOUS) ×2
IRRIGATION SUCT STRKRFLW 2 WTP (MISCELLANEOUS) ×1 IMPLANT
KIT BASIN OR (CUSTOM PROCEDURE TRAY) ×2 IMPLANT
KIT TURNOVER KIT A (KITS) IMPLANT
MARKER SKIN DUAL TIP RULER LAB (MISCELLANEOUS) ×2 IMPLANT
MAT PREVALON FULL STRYKER (MISCELLANEOUS) ×2 IMPLANT
NDL INSUFFLATION 14GA 120MM (NEEDLE) ×1 IMPLANT
NDL SPNL 22GX3.5 QUINCKE BK (NEEDLE) ×1 IMPLANT
NEEDLE INSUFFLATION 14GA 120MM (NEEDLE) ×2 IMPLANT
NEEDLE SPNL 22GX3.5 QUINCKE BK (NEEDLE) ×2 IMPLANT
RELOAD STAPLE 60 2.5 WHT DVNC (STAPLE) ×1 IMPLANT
RELOAD STAPLE 60 3.5 BLU DVNC (STAPLE) ×1 IMPLANT
RELOAD STAPLE 60 4.3 GRN DVNC (STAPLE) IMPLANT
RELOAD STAPLER 2.5X60 WHT DVNC (STAPLE) ×4 IMPLANT
RELOAD STAPLER 3.5X60 BLU DVNC (STAPLE) ×2 IMPLANT
RELOAD STAPLER 4.3X60 GRN DVNC (STAPLE) IMPLANT
SCISSORS LAP 5X35 DISP (ENDOMECHANICALS) IMPLANT
SEAL CANN UNIV 5-8 DVNC XI (MISCELLANEOUS) ×3 IMPLANT
SEAL XI 5MM-8MM UNIVERSAL (MISCELLANEOUS) ×6
SEALER VESSEL DA VINCI XI (MISCELLANEOUS) ×2
SEALER VESSEL EXT DVNC XI (MISCELLANEOUS) ×1 IMPLANT
SLEEVE GASTRECTOMY 40FR VISIGI (MISCELLANEOUS) ×2 IMPLANT
SOL ANTI FOG 6CC (MISCELLANEOUS) ×1 IMPLANT
SOLUTION ANTI FOG 6CC (MISCELLANEOUS) ×1
SOLUTION ELECTROLUBE (MISCELLANEOUS) ×2 IMPLANT
SPIKE FLUID TRANSFER (MISCELLANEOUS) ×2 IMPLANT
STAPLER 60 DA VINCI SURE FORM (STAPLE) ×2
STAPLER 60 SUREFORM DVNC (STAPLE) ×1 IMPLANT
STAPLER CANNULA SEAL DVNC XI (STAPLE) ×1 IMPLANT
STAPLER CANNULA SEAL XI (STAPLE) ×2
STAPLER RELOAD 2.5X60 WHITE (STAPLE) ×8
STAPLER RELOAD 2.5X60 WHT DVNC (STAPLE) ×4
STAPLER RELOAD 3.5X60 BLU DVNC (STAPLE) ×2
STAPLER RELOAD 3.5X60 BLUE (STAPLE) ×4
STAPLER RELOAD 4.3X60 GREEN (STAPLE)
STAPLER RELOAD 4.3X60 GRN DVNC (STAPLE)
SUT ETHIBOND 0 36 GRN (SUTURE) IMPLANT
SUT MNCRL AB 4-0 PS2 18 (SUTURE) ×3 IMPLANT
SUT VIC AB 2-0 SH 27 (SUTURE)
SUT VIC AB 2-0 SH 27X BRD (SUTURE) IMPLANT
SUT VICRYL 0 TIES 12 18 (SUTURE) ×2 IMPLANT
SUT VLOC 180 2-0 9IN GS21 (SUTURE) IMPLANT
SYR 20ML LL LF (SYRINGE) ×2 IMPLANT
TOWEL OR 17X26 10 PK STRL BLUE (TOWEL DISPOSABLE) ×2 IMPLANT
TOWEL OR NON WOVEN STRL DISP B (DISPOSABLE) ×2 IMPLANT
TRAY FOLEY MTR SLVR 16FR STAT (SET/KITS/TRAYS/PACK) IMPLANT
TROCAR ADV FIXATION 5X100MM (TROCAR) IMPLANT
TROCAR BLADELESS OPT 5 100 (ENDOMECHANICALS) ×2 IMPLANT
TUBING INSUFFLATION 10FT LAP (TUBING) ×2 IMPLANT

## 2022-03-31 NOTE — Anesthesia Procedure Notes (Signed)
Procedure Name: Intubation ?Date/Time: 03/31/2022 10:30 AM ?Performed by: Maxwell Caul, CRNA ?Pre-anesthesia Checklist: Patient identified, Emergency Drugs available, Suction available and Patient being monitored ?Patient Re-evaluated:Patient Re-evaluated prior to induction ?Oxygen Delivery Method: Circle system utilized ?Preoxygenation: Pre-oxygenation with 100% oxygen ?Induction Type: IV induction ?Ventilation: Mask ventilation without difficulty and Oral airway inserted - appropriate to patient size ?Laryngoscope Size: Mac and 4 ?Grade View: Grade I ?Tube type: Oral ?Tube size: 7.5 mm ?Number of attempts: 1 ?Airway Equipment and Method: Stylet and Oral airway ?Placement Confirmation: ETT inserted through vocal cords under direct vision, positive ETCO2 and breath sounds checked- equal and bilateral ?Secured at: 22 cm ?Tube secured with: Tape ?Dental Injury: Teeth and Oropharynx as per pre-operative assessment  ? ? ? ? ?

## 2022-03-31 NOTE — H&P (Signed)
Chief Complaint: Pre-op Exam (LSG and upper endo/) ? ?History of Present Illness: ?Kathryn Velazquez is a 36 y.o. female who is seen today for bariatric follow up. ? ?She has no new medications or medical problems. She has made some dietary improvements and is ready for the surgery. ? ?Review of Systems: ?A complete review of systems was obtained from the patient. I have reviewed this information and discussed as appropriate with the patient. See HPI as well for other ROS. ? ?Review of Systems  ?Constitutional: Negative.  ?HENT: Negative.  ?Eyes: Negative.  ?Respiratory: Negative.  ?Cardiovascular: Negative.  ?Gastrointestinal: Negative.  ?Genitourinary: Negative.  ?Musculoskeletal: Negative.  ?Skin: Negative.  ?Neurological: Negative.  ?Endo/Heme/Allergies: Negative.  ?Psychiatric/Behavioral: Negative.  ? ? ?Medical History: ?Past Medical History:  ?Diagnosis Date  ? Anemia  ? ?There is no problem list on file for this patient. ? ?Past Surgical History:  ?Procedure Laterality Date  ? CESAREAN SECTION  ?06/2007 02/2021  ? ? ?No Known Allergies ? ?No current outpatient medications on file prior to visit.  ? ?No current facility-administered medications on file prior to visit.  ? ?Family History  ?Problem Relation Age of Onset  ? High blood pressure (Hypertension) Father  ? Obesity Sister  ? Diabetes Brother  ? ? ?Social History  ? ?Tobacco Use  ?Smoking Status Former  ? Types: Cigarettes  ?Smokeless Tobacco Never  ? ? ?Social History  ? ?Socioeconomic History  ? Marital status: Single  ?Tobacco Use  ? Smoking status: Former  ?Types: Cigarettes  ? Smokeless tobacco: Never  ?Substance and Sexual Activity  ? Alcohol use: Yes  ? Drug use: Never  ? ?Objective:  ? ?Vitals:  ?03/20/22 1423  ?BP: 130/82  ?Pulse: 107  ?Temp: 36.6 ?C (97.8 ?F)  ?SpO2: 97%  ?Weight: (!) 123.9 kg (273 lb 2 oz)  ?Height: 157.5 cm ('5\' 2"'$ )  ? ?Body mass index is 49.96 kg/m?. ? ?Physical Exam ?Constitutional:  ?Appearance: Normal appearance.  ?HENT:   ?Head: Normocephalic and atraumatic.  ?Pulmonary:  ?Effort: Pulmonary effort is normal.  ?Musculoskeletal:  ?General: Normal range of motion.  ?Cervical back: Normal range of motion.  ?Neurological:  ?General: No focal deficit present.  ?Mental Status: She is alert and oriented to person, place, and time. Mental status is at baseline.  ?Psychiatric:  ?Mood and Affect: Mood normal.  ?Behavior: Behavior normal.  ?Thought Content: Thought content normal.  ? ? ? ?Labs, Imaging and Diagnostic Testing: ? ?I reviewed notes by Sandie Ano, labs, radiology results ? ?Assessment and Plan:  ? ?Diagnoses and all orders for this visit: ? ?Morbid (severe) obesity due to excess calories (CMS-HCC) ? ?Prediabetes ? ? ? ?The patient meets weight loss surgery criteria. Due to the above reasons, I think laparoscopic vertical sleeve gastrectomy is the best option for the patient.  ? ?We discussed LSG. We discussed the preoperative, operative and postoperative process. I explained the surgery in detail including the performance of an EGD near the end of the surgery to test for leak. We discussed the typical hospital course including a 1-2 day stay baring any complications. The patient was given educational material. I quoted the patient that most patients can lose up to 50-70% of their excess weight. We did discuss the possibility of weight regain several years after the procedure.  ? ?The risks of infection, bleeding, pain, scarring, weight regain, too little or too much weight loss, vitamin deficiencies and need for lifelong vitamin supplementation, hair loss, need for protein supplementation, leaks, stricture,  reflux, food intolerance, gallstone formation, hernia, need for reoperation, need for open surgery, injury to spleen or surrounding structures, DVT's, PE, and death again discussed with the patient and the patient expressed understanding and desires to proceed with laparoscopic sleeve gastrectomy, possible open,  intraoperative endoscopy. ? ?We discussed that before and after surgery that there would be an alteration in their diet. I explained that we may put them on a diet 2 weeks before surgery. I also explained that they would be on a liquid diet for 2 weeks after surgery. We discussed that they would have to avoid certain foods after surgery. We discussed the importance of physical activity as well as compliance with our dietary and supplement recommendations and routine follow-up. ?  ?

## 2022-03-31 NOTE — Transfer of Care (Signed)
Immediate Anesthesia Transfer of Care Note ? ?Patient: Kathryn Velazquez ? ?Procedure(s) Performed: XI ROBOTIC SLEEVE GASTRECTOMY ?UPPER GI ENDOSCOPY ? ?Patient Location: PACU ? ?Anesthesia Type:General ? ?Level of Consciousness: awake, alert  and oriented ? ?Airway & Oxygen Therapy: Patient Spontanous Breathing and Patient connected to face mask oxygen ? ?Post-op Assessment: Report given to RN and Post -op Vital signs reviewed and stable ? ?Post vital signs: Reviewed and stable ? ?Last Vitals:  ?Vitals Value Taken Time  ?BP 165/92 03/31/22 1204  ?Temp    ?Pulse 94 03/31/22 1205  ?Resp 20 03/31/22 1205  ?SpO2 100 % 03/31/22 1205  ?Vitals shown include unvalidated device data. ? ?Last Pain:  ?Vitals:  ? 03/31/22 0914  ?TempSrc: Oral  ?PainSc:   ?   ? ?  ? ?Complications: No notable events documented. ?

## 2022-03-31 NOTE — Progress Notes (Signed)
?  Transition of Care (TOC) Screening Note ? ? ?Patient Details  ?Name: Kathryn Velazquez ?Date of Birth: 1986/06/26 ? ? ?Transition of Care (TOC) CM/SW Contact:    ?Aashish Hamm, LCSW ?Phone Number: ?03/31/2022, 2:37 PM ? ? ? ?Transition of Care Department [facility]) has reviewed patient and no TOC needs have been identified at this time. We will continue to monitor patient advancement through interdisciplinary progression rounds. If new patient transition needs arise, please place a TOC consult. ? ? ?

## 2022-03-31 NOTE — Progress Notes (Signed)
PHARMACY CONSULT FOR:  Risk Assessment for Post-Discharge VTE Following Bariatric Surgery ? ?Post-Discharge VTE Risk Assessment: ?This patient's probability of 30-day post-discharge VTE is increased due to the factors marked: ?X Sleeve gastrectomy  ? Liver disorder (transplant, cirrhosis, or nonalcoholic steatohepatitis)  ? Hx of VTE  ? Hemorrhage requiring transfusion  ? GI perforation, leak, or obstruction  ? ====================================================  ?  Female  ?  Age >/=60 years  ?  BMI >/=50 kg/m2  ?  CHF  ?  Dyspnea at Rest  ?  Paraplegia  ? X Non-gastric-band surgery  ?  Operation Time >/=3 hr  ?  Return to OR   ?  Length of Stay >/= 3 d  ? Hypercoagulable condition  ? Significant venous stasis  ? ? ? ? ?Predicted probability of 30-day post-discharge VTE: 0.16% ? ? ?Recommendation for Discharge: ?No pharmacologic prophylaxis post-discharge ? ? ? ?Kathryn Velazquez is a 36 y.o. female who underwent  laparoscopic Sleeve Gastrectomy on 03/31/22. ?  ?Case start: 1051 ?Case end: 1155 ? ? ?No Known Allergies ? ?Patient Measurements: ?Height: '5\' 2"'$  (157.5 cm) ?Weight: 117.9 kg (260 lb) ?IBW/kg (Calculated) : 50.1 ?Body mass index is 47.55 kg/m?. ? ?Recent Labs  ?  03/31/22 ?0915  ?CREATININE 0.72  ?ALBUMIN 4.1  ?PROT 8.3*  ?AST 23  ?ALT 26  ?ALKPHOS 95  ?BILITOT 0.7  ? ?Estimated Creatinine Clearance: 120.8 mL/min (by C-G formula based on SCr of 0.72 mg/dL). ? ? ? ?Past Medical History:  ?Diagnosis Date  ? Anemia 11/10/2018  ? Anxiety and depression   ? Gestational diabetes   ? 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 Medication management 05/28/2020  ? Palpitations 01/06/2021  ? Pre-diabetes   ? Pregnancy 01/06/2021  ? Tachycardia   ? Thrombocytosis 11/10/2018  ? ? ? ?No medications prior to admission.  ? ? ? ? ? ?Dia Sitter P ?03/31/2022,12:40 PM ? ?

## 2022-03-31 NOTE — Op Note (Addendum)
Preop Diagnosis: Obesity Class III ? ?Postop Diagnosis: same ? ?Procedure performed: laparoscopic Sleeve Gastrectomy, upper endoscopy ? ?Assitant: Kaylyn Lim ? ?Indications:  ?The patient is a 36 y.o. year-old morbidly obese female who has been followed in the Bariatric Clinic as an outpatient. This patient was diagnosed with morbid obesity with a BMI of Body mass index is 47.55 kg/m?Marland Kitchen and significant co-morbidities including hypertension.  The patient was counseled extensively in the Bariatric Outpatient Clinic and after a thorough explanation of the risks and benefits of surgery (including death from complications, bowel leak, infection such as peritonitis and/or sepsis, internal hernia, bleeding, need for blood transfusion, bowel obstruction, organ failure, pulmonary embolus, deep venous thrombosis, wound infection, incisional hernia, skin breakdown, and others entailed on the consent form) and after a compliant diet and exercise program, the patient was scheduled for an elective laparoscopic sleeve gastrectomy. ? ?Description of Operation:  ?Following informed consent, the patient was taken to the operating room and placed on the operating table in the supine position.  She had previously received prophylactic antibiotics and subcutaneous heparin for DVT prophylaxis in the pre-op holding area.  After induction of general endotracheal anesthesia by the anesthesiologist, the patient underwent placement of sequential compression devices and an oro-gastric tube.  A timeout was confirmed by the surgery and anesthesia teams.  The patient was adequately padded at all pressure points and placed on a footboard to prevent slippage from the OR table during extremes of position during surgery.  She underwent a routine sterile prep and drape of her entire abdomen.   ? ?Next, A transverse incision was made at the left mid abdomen area and a 85m optical viewing trocar was introduced into the peritoneal cavity.  Pneumoperitoneum was applied with a high flow and low pressure. A laparoscope was inserted to confirm placement. A extraperitoneal block was then placed at the lateral abdominal wall using exparel diluted with marcaine. 5 additional incisions were placed: 1 175mtrocar to the right of the midline. 1 additional 2m59mrocar in the left mid abdominal area, 1 2mm72mocar in the left lateral abdomen, 1 5mm 56mcar in the left lower quadrant subcostal area, and a Nathanson retractor was placed through a subxiphoid incision. ? ?Next, a hole was created through the lesser omentum along the greater curve of the stomach to enter the lesser sac. The vessels along the greater omentum were  Then ligated and divided using the Harmonic scalpel moving towards the spleen and then short gastric vessels were ligated and divided in the same fashion to fully mobilize the fundus. The left crus was identified to ensure completion of the dissection. Next the antrum was measured and dissection continued inferiorly along the greater curve towards the pylorus and stopped 6cm from the pylorus. ? ? A 40Fr ViSiGi dilator was placed into the esophgaus and along the lesser curve of the stomach and placed on suction.  2 60mm 51m load robot stapler(s)  followed by 4 60mm w72m load robotic stapler(s)were used to make the resection along the antrum being sure to stay well away from the angularis by angling the jaws of the stapler towards the greater curve and later completing the resection staying along the ViSiGi Adamssuring the fundus was not retained by appropriately retracting it lateral. Air was inserted through the ViSiGi Edwardsform a leak test showing no bubbles and a neutral lie of the stomach. ? ?Next upper endoscopy was performed. GE junction was seen at 35 cm. No bleeding was seen. No  bubbles were seen and the sleeve and antrum distended appropriately. The specimen was then placed in an endocatch bag and removed by the 62m port. The fascia  of the 113mport was closed with a 0 vicryl by suture passer. Hemostasis was ensured. Pneumoperitoneum was evacuated, all ports were removed and all incisions closed with 4-0 monocryl suture in subcuticular fashion. Steristrips and bandaids were put in place for dressing. The patient awoke from anesthesia and was brought to pacu in stable condition. All counts were correct. ? ?Estimated blood loss: <3016m ?Specimens:  ?Sleeve gastrectomy ? ?Local Anesthesia: 50 ml Exparel:0.5% Marcaine mix ? ?Post-Op Plan:  ?     Pain Management: PO, prn ?     Antibiotics: Prophylactic ?     Anticoagulation: Prophylactic, Starting now ?     Post Op Studies/Consults: Not applicable ?     Intended Discharge: within 48h ?     Intended Outpatient Follow-Up: Two Week ?     Intended Outpatient Studies: Not Applicable ?     Other: Not Applicable ? ? ?Kathryn Velazquez ? ? ?

## 2022-03-31 NOTE — Anesthesia Preprocedure Evaluation (Signed)
Anesthesia Evaluation  ?Patient identified by MRN, date of birth, ID band ?Patient awake ? ? ? ?Reviewed: ?Allergy & Precautions, NPO status , Patient's Chart, lab work & pertinent test results ? ?Airway ?Mallampati: II ? ?TM Distance: >3 FB ?Neck ROM: Full ? ? ? Dental ? ?(+) Dental Advisory Given ?  ?Pulmonary ?Current Smoker and Patient abstained from smoking.,  ?  ?breath sounds clear to auscultation ? ? ? ? ? ? Cardiovascular ?negative cardio ROS ? ? ?Rhythm:Regular Rate:Normal ? ? ?  ?Neuro/Psych ?negative neurological ROS ?   ? GI/Hepatic ?negative GI ROS, Neg liver ROS,   ?Endo/Other  ?negative endocrine ROS ? Renal/GU ?negative Renal ROS  ? ?  ?Musculoskeletal ? ? Abdominal ?  ?Peds ? Hematology ?negative hematology ROS ?(+)   ?Anesthesia Other Findings ? ? Reproductive/Obstetrics ? ?  ? ? ? ? ? ? ? ? ? ? ? ? ? ?  ?  ? ? ? ? ? ? ? ? ?Anesthesia Physical ?Anesthesia Plan ? ?ASA: 3 ? ?Anesthesia Plan: General  ? ?Post-op Pain Management: Tylenol PO (pre-op)*, Ketamine IV* and Lidocaine infusion*  ? ?Induction: Intravenous ? ?PONV Risk Score and Plan: 3 and Dexamethasone, Ondansetron, Midazolam and Treatment may vary due to age or medical condition ? ?Airway Management Planned:  ? ?Additional Equipment: None ? ?Intra-op Plan:  ? ?Post-operative Plan: Extubation in OR ? ?Informed Consent: I have reviewed the patients History and Physical, chart, labs and discussed the procedure including the risks, benefits and alternatives for the proposed anesthesia with the patient or authorized representative who has indicated his/her understanding and acceptance.  ? ? ? ?Dental advisory given ? ?Plan Discussed with:  ? ?Anesthesia Plan Comments:   ? ? ? ? ? ? ?Anesthesia Quick Evaluation ? ?

## 2022-03-31 NOTE — Anesthesia Postprocedure Evaluation (Signed)
Anesthesia Post Note ? ?Patient: Kathryn Velazquez ? ?Procedure(s) Performed: XI ROBOTIC SLEEVE GASTRECTOMY ?UPPER GI ENDOSCOPY ? ?  ? ?Patient location during evaluation: PACU ?Anesthesia Type: General ?Level of consciousness: awake and alert ?Pain management: pain level controlled ?Vital Signs Assessment: post-procedure vital signs reviewed and stable ?Respiratory status: spontaneous breathing, nonlabored ventilation, respiratory function stable and patient connected to nasal cannula oxygen ?Cardiovascular status: blood pressure returned to baseline and stable ?Postop Assessment: no apparent nausea or vomiting ?Anesthetic complications: no ? ? ?No notable events documented. ? ?Last Vitals:  ?Vitals:  ? 03/31/22 1503 03/31/22 1605  ?BP: (!) 163/98 (!) 151/93  ?Pulse: 97 94  ?Resp: 18 18  ?Temp: 37 ?C 37 ?C  ?SpO2: 100% 100%  ?  ?Last Pain:  ?Vitals:  ? 03/31/22 1605  ?TempSrc: Oral  ?PainSc:   ? ? ?  ?  ?  ?  ?  ?  ? ?Suzette Battiest E ? ? ? ? ?

## 2022-04-01 ENCOUNTER — Encounter (HOSPITAL_COMMUNITY): Payer: Self-pay | Admitting: General Surgery

## 2022-04-01 LAB — CBC WITH DIFFERENTIAL/PLATELET
Abs Immature Granulocytes: 0.04 10*3/uL (ref 0.00–0.07)
Basophils Absolute: 0 10*3/uL (ref 0.0–0.1)
Basophils Relative: 0 %
Eosinophils Absolute: 0 10*3/uL (ref 0.0–0.5)
Eosinophils Relative: 0 %
HCT: 38.3 % (ref 36.0–46.0)
Hemoglobin: 12.1 g/dL (ref 12.0–15.0)
Immature Granulocytes: 0 %
Lymphocytes Relative: 9 %
Lymphs Abs: 1 10*3/uL (ref 0.7–4.0)
MCH: 25.1 pg — ABNORMAL LOW (ref 26.0–34.0)
MCHC: 31.6 g/dL (ref 30.0–36.0)
MCV: 79.3 fL — ABNORMAL LOW (ref 80.0–100.0)
Monocytes Absolute: 0.7 10*3/uL (ref 0.1–1.0)
Monocytes Relative: 6 %
Neutro Abs: 9.7 10*3/uL — ABNORMAL HIGH (ref 1.7–7.7)
Neutrophils Relative %: 85 %
Platelets: 454 10*3/uL — ABNORMAL HIGH (ref 150–400)
RBC: 4.83 MIL/uL (ref 3.87–5.11)
RDW: 15.4 % (ref 11.5–15.5)
WBC: 11.5 10*3/uL — ABNORMAL HIGH (ref 4.0–10.5)
nRBC: 0 % (ref 0.0–0.2)

## 2022-04-01 LAB — SURGICAL PATHOLOGY

## 2022-04-01 MED ORDER — ACETAMINOPHEN 500 MG PO TABS: 1000.0000 mg | ORAL_TABLET | Freq: Three times a day (TID) | ORAL | 0 refills | Status: AC

## 2022-04-01 MED ORDER — ONDANSETRON 4 MG PO TBDP: 4.0000 mg | ORAL_TABLET | Freq: Four times a day (QID) | ORAL | 0 refills | Status: DC | PRN

## 2022-04-01 MED ORDER — PANTOPRAZOLE SODIUM 40 MG PO TBEC: 40.0000 mg | DELAYED_RELEASE_TABLET | Freq: Every day | ORAL | 0 refills | Status: AC

## 2022-04-01 MED ORDER — OXYCODONE HCL 5 MG PO TABS: 5.0000 mg | ORAL_TABLET | Freq: Four times a day (QID) | ORAL | 0 refills | Status: DC | PRN

## 2022-04-01 NOTE — Progress Notes (Signed)
Discharge instructions discussed with patient, verbalized agreement and understanding 

## 2022-04-01 NOTE — Progress Notes (Signed)
24hr fluid recall prior to discharge: 516m.  Per dehydration protocol, will call pt to f/u within one week post op. ? ?

## 2022-04-01 NOTE — Discharge Summary (Signed)
?Physician Discharge Summary  ?Kathryn Velazquez KPV:[id number] DOB: 1985/12/19 DOA: 03/31/2022 ? ?PCP: Girtha Rm, NP-C ? ?Admit date: 03/31/2022 ?Discharge date: 04/01/2022 ? ?Recommendations for Outpatient Follow-up:  ? (include homehealth, outpatient follow-up instructions, specific recommendations for PCP to follow-up on, etc.) ? ? ?Discharge Diagnoses:  ?Principal Problem: ?  Morbid (severe) obesity due to excess calories ([place]) ? ? ?Surgical Procedure: laparoscopic sleeve gastrectomy, upper endoscopy ? ?Discharge Condition: Good ?Disposition: Home ? ?Diet recommendation: Postoperative sleeve gastrectomy diet (liquids only) ? ?Filed Weights  ? 03/31/22 0859  ?Weight: 117.9 kg  ? ? ? ?Hospital Course:  ?The patient was admitted after undergoing laparoscopic sleeve gastrectomy. POD 0 she ambulated well. POD 1 she was started on the water diet protocol and tolerated 400 ml in the first shift. Once meeting the water amount she was advanced to bariatric protein shakes which they tolerated and were discharged home POD 1. ? ?Treatments: surgery: laparoscopic sleeve gastrectomy ? ?Discharge Instructions ? ?Discharge Instructions   ? ? Ambulate hourly while awake   Complete by: As directed ?  ? Call MD for:  difficulty breathing, headache or visual disturbances   Complete by: As directed ?  ? Call MD for:  persistant dizziness or light-headedness   Complete by: As directed ?  ? Call MD for:  persistant nausea and vomiting   Complete by: As directed ?  ? Call MD for:  redness, tenderness, or signs of infection (pain, swelling, redness, odor or green/yellow discharge around incision site)   Complete by: As directed ?  ? Call MD for:  severe uncontrolled pain   Complete by: As directed ?  ? Call MD for:  temperature >101 F   Complete by: As directed ?  ? Diet bariatric full liquid   Complete by: As directed ?  ? Discharge wound care:   Complete by: As directed ?  ? Remove Bandaids tomorrow, ok to shower tomorrow.  Steristrips may fall off in 1-3 weeks.  ? Incentive spirometry   Complete by: As directed ?  ? Perform hourly while awake  ? ?  ? ?Allergies as of 04/01/2022   ?No Known Allergies ?  ? ?  ?Medication List  ?  ? ?TAKE these medications   ? ?acetaminophen 500 MG tablet ?Commonly known as: TYLENOL ?Take 2 tablets (1,000 mg total) by mouth every 8 (eight) hours for 5 days. ?  ?ondansetron 4 MG disintegrating tablet ?Commonly known as: ZOFRAN-ODT ?Take 1 tablet (4 mg total) by mouth every 6 (six) hours as needed for nausea or vomiting. ?  ?oxyCODONE 5 MG immediate release tablet ?Commonly known as: Oxy IR/ROXICODONE ?Take 1 tablet (5 mg total) by mouth every 6 (six) hours as needed for severe pain. ?  ?pantoprazole 40 MG tablet ?Commonly known as: PROTONIX ?Take 1 tablet (40 mg total) by mouth daily. ?  ? ?  ? ?  ?  ? ? ?  ?Discharge Care Instructions  ?(From admission, onward)  ?  ? ? ?  ? ?  Start     Ordered  ? 04/01/22 0000  Discharge wound care:       ?Comments: Remove Bandaids tomorrow, ok to shower tomorrow. Steristrips may fall off in 1-3 weeks.  ? 04/01/22 0825  ? ?  ?  ? ?  ? ? ? ? ?The results of significant diagnostics from this hospitalization (including imaging, microbiology, ancillary and laboratory) are listed below for reference.   ? ?Significant Diagnostic Studies: ?No results found. ? ?  Labs: ?Basic Metabolic Panel: ?Recent Labs  ?Lab 03/26/22 ?0109 03/31/22 ?3235 03/31/22 ?1401  ?NA 139 137  --   ?K 3.8 4.0  --   ?CL 109 105  --   ?CO2 24 24  --   ?GLUCOSE 91 90  --   ?BUN 17 14  --   ?CREATININE 0.68 0.72 0.81  ?CALCIUM 9.2 9.0  --   ? ?Liver Function Tests: ?Recent Labs  ?Lab 03/31/22 ?0915  ?AST 23  ?ALT 26  ?ALKPHOS 95  ?BILITOT 0.7  ?PROT 8.3*  ?ALBUMIN 4.1  ? ? ?CBC: ?Recent Labs  ?Lab 03/26/22 ?5732 03/31/22 ?1536 04/01/22 ?0454  ?WBC 8.7 16.7* 11.5*  ?NEUTROABS  --   --  9.7*  ?HGB 12.8 12.3 12.1  ?HCT 38.4 39.6 38.3  ?MCV 78.9* 80.7 79.3*  ?PLT 483* 441* 454*  ? ? ?CBG: ?Recent Labs  ?Lab  03/26/22 ?0820 03/31/22 ?0844  ?GLUCAP 95 89  ? ? ?Principal Problem: ?  Morbid (severe) obesity due to excess calories ([place]) ? ? ?VTE plan: no chemical prophylaxis recommended ?[email or web address]) ? ?Time coordinating discharge: 15 min ? ? ?

## 2022-04-01 NOTE — Progress Notes (Signed)
Patient alert and oriented, pain is controlled. Patient is tolerating fluids, advanced to protein shake today, patient is tolerating well.  Reviewed Gastric sleeve discharge instructions with patient and patient is able to articulate understanding.  Provided information on BELT program, Support Group and [facility] outpatient pharmacy. All questions answered, will continue to monitor.  

## 2022-04-01 NOTE — Discharge Instructions (Signed)
GASTRIC BYPASS / SLEEVE  ?Home Care Instructions ? ?These instructions are to help you care for yourself when you go home. ? ?Call: If you have any problems. ?Call [phone] and ask for the surgeon on call ?If you have an emergency related to your surgery please use the ER at [facility].  ?Tell the ER staff that you are a new post-op gastric bypass or gastric sleeve patient ?  ?Signs and symptoms to report: Severe vomiting or nausea ?If you cannot handle clear liquids for longer than 1 day, call your surgeon  ?Abdominal pain which does not get better after taking your pain medication ?Fever greater than 100.4? F and chills ?Heart rate over 100 beats a [facility] ?Trouble breathing ?Chest pain ? Redness, swelling, drainage, or foul odor at incision (surgical) sites ? If your incisions open or pull apart ?Swelling or pain in calf (lower leg) ?Diarrhea (Loose bowel movements that happen often), frequent watery, uncontrolled bowel movements ?Constipation, (no bowel movements for 3 days) if this happens:  ?Take Milk of Magnesia, 2 tablespoons by mouth, 3 times a day for 2 days if needed ?Stop taking Milk of Magnesia once you have had a bowel movement ?Call your doctor if constipation continues ?Or ?Take Miralax  (instead of Milk of Magnesia) following the label instructions ?Stop taking Miralax once you have had a bowel movement ?Call your doctor if constipation continues ?Anything you think is ?abnormal for you? ?  ?Normal side effects after surgery: Unable to sleep at night or unable to concentrate ?Irritability ?Being tearful (crying) or depressed ?These are common complaints, possibly related to your anesthesia, stress of surgery and change in lifestyle, that usually go away a few weeks after surgery.  If these feelings continue, call your medical doctor.  ?Wound Care: You may have surgical glue, steri-strips, or staples over your incisions after surgery ?Surgical glue:  Looks like a clear film over your incisions  and will wear off a little at a time ?Steri-strips : Adhesive strips of tape over your incisions. You may notice a yellowish color on the skin under the steri-strips. This is used to make the   steri-strips stick better. Do not pull the steri-strips off - let them fall off ?Staples: Staples may be removed before you leave the hospital ?If you go home with staples, call [facility] at for an appointment with your surgeon?s nurse to have staples removed 10 days after surgery, [phone] ?Showering: You may shower two (2) days after your surgery unless your surgeon tells you differently ?Wash gently around incisions with warm soapy water, rinse well, and gently pat dry  ?If you have a drain (tube from your incision), you may need someone to hold this while you shower  ?No tub baths until staples are removed and incisions are healed   ?  ?Medications: Medications should be liquid or crushed if larger than the size of a dime ?Extended release pills (medication that releases a little bit at a time through the day) should not be crushed ?Depending on the size and number of medications you take, you may need to space (take a few throughout the day)/change the time you take your medications so that you do not over-fill your pouch (smaller stomach) ?Make sure you follow-up with your primary care physician to make medication changes needed during rapid weight loss and life-style changes ?If you have diabetes, follow up with the doctor that orders your diabetes medication(s) within one week after surgery and check   your blood sugar regularly. ?Do not drive while taking narcotics (pain medications) ?DO NOT take NSAID'S (Examples of NSAID's include ibuprofen, naproxen)  ?Diet:                    First 2 Weeks ? You will see the nutritionist about two (2) weeks after your surgery. The nutritionist will increase the types of foods you can eat if you are handling liquids well: ?If you have severe vomiting or nausea  and cannot handle clear liquids lasting longer than 1 day, call your surgeon  ?Protein Shake ?Drink at least 2 ounces of shake 5-6 times per day ?Each serving of protein shakes (usually 8 - 12 ounces) should have a minimum of:  ?15 grams of protein  ?And no more than 5 grams of carbohydrate  ?Goal for protein each day: ?Men = 80 grams per day ?Women = 60 grams per day ?Protein powder may be added to fluids such as non-fat milk or Lactaid milk or Soy milk (limit to 35 grams added protein powder per serving) ? ?Hydration ?Slowly increase the amount of water and other clear liquids as tolerated (See Acceptable Fluids) ?Slowly increase the amount of protein shake as tolerated  ? Sip fluids slowly and throughout the day ?May use sugar substitutes in small amounts (no more than 6 - 8 packets per day; i.e. Splenda) ? ?Fluid Goal ?The first goal is to drink at least 8 ounces of protein shake/drink per day (or as directed by the nutritionist);  See handout from pre-op Bariatric Education Class for examples of protein shake/drink.   ?Slowly increase the amount of protein shake you drink as tolerated ?You may find it easier to slowly sip shakes throughout the day ?It is important to get your proteins in first ?Your fluid goal is to drink 64 - 100 ounces of fluid daily ?It may take a few weeks to build up to this ?32 oz (or more) should be clear liquids  ?And  ?32 oz (or more) should be full liquids (see below for examples) ?Liquids should not contain sugar, caffeine, or carbonation ? ?Clear Liquids: ?Water or Sugar-free flavored water (i.e. Fruit H2O, Propel) ?Decaffeinated coffee or tea (sugar-free) ?[facility], [name], [facility]quez ?Sugar-free Jell-O ?Bouillon or broth ?Sugar-free Popsicle:   *Less than 20 calories each; Limit 1 per day ? ?Full Liquids: ?Protein Shakes/Drinks + 2 choices per day of other full liquids ?Full liquids must be: ?No More Than 12 grams of Carbs per serving  ?No More Than 3 grams of Fat  per serving ?Strained low-fat cream soup ?Non-Fat milk ?Fat-free Lactaid Milk ?Sugar-free yogurt ([name] [facility]azquez [employer] Fit, Greek yogurt) ? ? ? ?  ?Vitamins and Minerals Start 1 day after surgery unless otherwise directed by your surgeon ?Bariatric Specific Complete Multivitamins ?Chewable Calcium Citrate with Vitamin D-3 ?(Example: 3 Chewable Calcium Plus 600 with Vitamin D-3) ?Take 500 mg three (3) times a day for a total of 1500 mg each day ?Do not take all 3 doses of calcium at one time as it may cause constipation, and you can only absorb 500 mg  at a time  ?Do not mix multivitamins containing iron with calcium supplements; take 2 hours apart ? ?Menstruating women and those at risk for anemia (a blood disease that causes weakness) may need extra iron ?Talk with your doctor to see if you need more iron ?If you need extra iron: Total daily Iron recommendation (including Vitamins) is 50 to 100   mg Iron/day ?Do not stop taking or change any vitamins or minerals until you talk to your nutritionist or surgeon ?Your nutritionist and/or surgeon must approve all vitamin and mineral supplements ?  ?Activity and Exercise: It is important to continue walking at home.  Limit your physical activity as instructed by your doctor.  During this time, use these guidelines: ?Do not lift anything greater than ten (10) pounds for at least two (2) weeks ?Do not go back to work or drive until your surgeon says you can ?You may have sex when you feel comfortable  ?It is VERY important for female patients to use a reliable birth control method; fertility often increases after surgery  ?Do not get pregnant for at least 18 months ?Start exercising as soon as your doctor tells you that you can ?Make sure your doctor approves any physical activity ?Start with a simple walking program ?Walk 5-15 minutes each day, 7 days per week.  ?Slowly increase until you are walking 30-45 minutes per day ?Consider joining our BELT program. [phone] or email  [email or web address] ?  ?Special Instructions Things to remember: ? ?Use your CPAP when sleeping if this applies to you, do not stop the use of CPAP unless directed by physician after a sleep study ?[place]

## 2022-04-04 ENCOUNTER — Telehealth (HOSPITAL_COMMUNITY): Payer: Self-pay | Admitting: *Deleted

## 2022-04-04 NOTE — Telephone Encounter (Signed)
1.  Tell me about your pain and pain management? Pt c/o right lower abdominal incisional pain with movement and exertion.  Pt states that she has been taking tylenol and oxycodone at night. Discussed with patient to try and splint her abdomen with changing positions to assist with the discomfort.  Encouraged pt to try options and/or contact [facility] if still concerned.   2.  Let's talk about fluid intake.  How much total fluid are you taking in? Pt states that she is getting in at least 56oz of fluid including protein shakes and bottled water. Pt instructed to assess status and suggestions daily utilizing Hydration Action Plan on discharge folder and to call [facility] if in the "red zone".   3.  How much protein have you taken in the last 2 days? Pt states that she is working to meet goal of goal of 60g of protein today.  Pt has already consumed one protein shake.  Pt plans to drink remainder of protein throughout the rest of the day to meet goal.  4.  Have you had nausea?  Tell me about when have experienced nausea and what you did to help? Pt denies nausea.   5.  Has the frequency or color changed with your urine? Pt states that she is urinating "fine" with no changes in frequency or urgency.     6.  Tell me what your incisions look like? "Incisions look fine". Pt denies a fever, chills.  Pt states incisions are not swollen, open, or draining.  Pt encouraged to call [facility] if incisions change.   7.  Have you been passing gas? BM? Pt states that she has not had a BM.  Pt instructed to take either Miralax or MoM as instructed per "Gastric Bypass/Sleeve Discharge Home Care Instructions".  Pt to call surgeon's office if not able to have BM with medication.   8.  If a problem or question were to arise who would you call?  Do you know contact numbers for [place], [facility], and [facility]? Pt denies dehydration symptoms.  Pt can describe s/sx of dehydration.  Pt knows to call [facility] for surgical, [facility] for nutrition, and [facility] for  non-urgent questions or concerns.   9.  How has the walking going? Pt states she is walking around and able to be active without difficulty.   10. Are you still using your incentive spirometer?  If so, how often? Pt states that she forgot her I.S. at discharge.  Discussed with the pt about TCDB and being intentional about performing the lung exercise at least 10x every hour while awake until she sees the surgeon.  11.  How are your vitamins and calcium going?  How are you taking them? Pt states that she will begin the supplements on Monday.  Reinforced education about taking supplements at least two hours apart.  Reminded patient that the first 30 days post-operatively are important for successful recovery.  Practice good hand hygiene, wearing a mask when appropriate (since optional in most places), and minimizing exposure to people who live outside of the home, especially if they are exhibiting any respiratory, GI, or illness-like symptoms.

## 2022-04-15 ENCOUNTER — Encounter: Payer: BC Managed Care – PPO | Attending: General Surgery | Admitting: Dietician

## 2022-04-15 ENCOUNTER — Encounter: Payer: Self-pay | Admitting: Dietician

## 2022-04-15 DIAGNOSIS — E669 Obesity, unspecified: Secondary | ICD-10-CM | POA: Insufficient documentation

## 2022-04-15 NOTE — Progress Notes (Addendum)
2 Week Post-Operative Nutrition Class   Patient was seen on 04/15/2022 for Post-Operative Nutrition education at [facility].    Surgery date: 03/31/2022 Surgery type: Sleeve Gastrectomy   Anthropometrics: Weight: 247.1lb BMI: 46.69    Patient's Goal Weight: 170lbs  Clinical: Medical History: pre diabetes Medications and Supplements: albuterol inhaler prn, pantoprazole prn, ibuprofen prn Relevant labs: HbA1C 5.7% 05/07/21 Notable symptoms: decreased stamina Drug allergies: none known Food allergies: none known   Body Composition Scale 04/15/2022  Current Body Weight 247.1  Total Body Fat % 47.1  Visceral Fat 15  Fat-Free Mass % 52.8   Total Body Water % 40.9  Muscle-Mass lbs 29.2  BMI 46.8  Body Fat Displacement          Torso  lbs 72.2         Left Leg  lbs 14.4         Right Leg  lbs 14.4         Left Arm  lbs 7.2         Right Arm   lbs 7.2      The following the learning objectives were met by the patient during this course: Identifies Phase 3 (Soft, High Proteins) Dietary Goals and will begin from 2 weeks post-operatively to 2 months post-operatively Identifies appropriate sources of fluids and proteins  Identifies appropriate fat sources and healthy verses unhealthy fat types   States protein recommendations and appropriate sources post-operatively Identifies the need for appropriate texture modifications, mastication, and bite sizes when consuming solids Identifies appropriate fat consumption and sources Identifies appropriate multivitamin and calcium sources post-operatively Describes the need for physical activity post-operatively and will follow MD recommendations States when to call healthcare provider regarding medication questions or post-operative complications   Handouts given during class include: Phase 3A: Soft, High Protein Diet Handout Phase 3 High Protein Meals Healthy Fats   Follow-Up Plan: Patient will  follow-up at [facility] in 6 weeks for 2 month post-op nutrition visit for diet advancement per MD.

## 2022-04-21 ENCOUNTER — Telehealth: Payer: Self-pay | Admitting: Dietician

## 2022-04-21 NOTE — Telephone Encounter (Signed)
RD called pt to verify fluid intake once starting soft, solid proteins 2 week post-bariatric surgery.   Daily Fluid intake: Daily Protein intake: Bowel Habits:   Concerns/issues:    Tried to leave voice message... mailbox full.

## 2022-05-27 ENCOUNTER — Encounter: Payer: BC Managed Care – PPO | Attending: Family Medicine | Admitting: Skilled Nursing Facility1

## 2022-05-27 ENCOUNTER — Encounter: Payer: Self-pay | Admitting: Skilled Nursing Facility1

## 2022-05-27 DIAGNOSIS — Z6841 Body Mass Index (BMI) 40.0 and over, adult: Secondary | ICD-10-CM | POA: Insufficient documentation

## 2022-05-27 DIAGNOSIS — R7303 Prediabetes: Secondary | ICD-10-CM | POA: Diagnosis not present

## 2022-05-27 DIAGNOSIS — Z713 Dietary counseling and surveillance: Secondary | ICD-10-CM | POA: Diagnosis not present

## 2022-05-27 DIAGNOSIS — E569 Vitamin deficiency, unspecified: Secondary | ICD-10-CM | POA: Insufficient documentation

## 2022-05-27 DIAGNOSIS — E669 Obesity, unspecified: Secondary | ICD-10-CM

## 2022-05-27 NOTE — Progress Notes (Signed)
Bariatric Nutrition Follow-Up Visit Medical Nutrition Therapy    NUTRITION ASSESSMENT    Surgery date: 03/31/2022 Surgery type: Sleeve Gastrectomy   Anthropometrics: Weight: 247.1lb    Patient's Goal Weight: 170lbs  Clinical: Medical History: pre diabetes Medications and Supplements: albuterol inhaler prn, pantoprazole prn, ibuprofen prn Relevant labs: HbA1C 5.7% 05/07/21 Notable symptoms: decreased stamina Drug allergies: none known Food allergies: none known   Body Composition Scale 04/15/2022 05/27/2022  Current Body Weight 247.1 234.8  Total Body Fat % 47.1 44.2  Visceral Fat 15 13  Fat-Free Mass % 52.8 55.7   Total Body Water % 40.9 42.3  Muscle-Mass lbs 29.2 31.1  BMI 46.8 42.8  Body Fat Displacement           Torso  lbs 72.2 64.4         Left Leg  lbs 14.4 12.8         Right Leg  lbs 14.4 12.8         Left Arm  lbs 7.2 6.4         Right Arm   lbs 7.2 6.4     Lifestyle & Dietary Hx  Pt states she has a bowel movement about 2-3 times a week.   Pt states she has been skipping meals most days of the week stating she was not hungry so she does not eat (after further discussion there is a fear of weight gain).  Pt states she has had some vodka: dietitian educated pt on this topic.  Pt state she is a little of obsessive over the scale and sees where she will overcompensate; pt states she weighs 2-3 times a day and also wakes in the middle of the night to weigh.  Pt states her fiancee works out of town so is only home every other week.   Estimated daily fluid intake: 40-50 oz Estimated daily protein intake: 40 g Supplements:  Current average weekly physical activity: just started walking a mile a day   24-Hr Dietary Recall First Meal: greek yogurt or [place] bacon or skipped Snack:   Second Meal: tuna pack or skipped Snack:   Third Meal: shrimp or tuna + beans Snack:  Beverages: water, Gatorade zero, vodka  Post-Op Goals/ Signs/ Symptoms Using straws:  no Drinking while eating: no Chewing/swallowing difficulties: no Changes in vision: no Changes to mood/headaches: no Hair loss/changes to skin/nails: no Difficulty focusing/concentrating: no Sweating: no Limb weakness: no Dizziness/lightheadedness: no Palpitations: no  Carbonated/caffeinated beverages: no N/V/D/C/Gas: no Abdominal pain: no Dumping syndrome: no    NUTRITION DIAGNOSIS  Overweight/obesity ([place]-3.3) related to past poor dietary habits and physical inactivity as evidenced by completed bariatric surgery and following dietary guidelines for continued weight loss and healthy nutrition status.     NUTRITION INTERVENTION Nutrition counseling (C-1) and education (E-2) to facilitate bariatric surgery goals, including: Diet advancement to the next phase (phase 4) now including non starchy vegetables The importance of consuming adequate calories as well as certain nutrients daily due to the body's need for essential vitamins, minerals, and fats The importance of daily physical activity and to reach a goal of at least 150 minutes of moderate to vigorous physical activity weekly (or as directed by their physician) due to benefits such as increased musculature and improved lab values The importance of intuitive eating specifically learning hunger-satiety cues and understanding the importance of learning a new body: The importance of mindful eating to avoid grazing behaviors  Encouraged patient to honor their body's internal hunger and  fullness cues.  Throughout the day, check in mentally and rate hunger. Stop eating when satisfied not full regardless of how much food is left on the plate.  Get more if still hungry 20-30 minutes later.  The key is to honor satisfaction so throughout the meal, rate fullness factor and stop when comfortably satisfied not physically full. The key is to honor hunger and fullness without any feelings of guilt or shame.  Pay attention to what the internal cues  are, rather than any external factors. This will enhance the confidence you have in listening to your own body and following those internal cues enabling you to increase how often you eat when you are hungry not out of appetite and stop when you are satisfied not full.  Encouraged pt to continue to eat balanced meals inclusive of non starchy vegetables 2 times a day 7 days a week Encouraged pt to choose lean protein sources: limiting beef, pork, sausage, hotdogs, and lunch meat Encourage pt to choose healthy fats such as plant based limiting animal fats Encouraged pt to continue to drink a minium 64 fluid ounces with half being plain water to satisfy proper hydration   Handouts Provided Include  Phase 7  Learning Style & Readiness for Change Teaching method utilized: Visual & Auditory  Demonstrated degree of understanding via: Teach Back  Readiness Level: action Barriers to learning/adherence to lifestyle change: fear/anxiety  RD's Notes for Next Visit Assess adherence to pt chosen goals    MONITORING & EVALUATION Dietary intake, weekly physical activity, body weight  Next Steps Patient is to follow-up in 3 months but advised to call or email if cannot follow the plan or needs any support

## 2022-07-25 IMAGING — RF DG UGI W SINGLE CM
11 of 13 series · 14 of 24 positions shown · non-contrast
Comparison: NONE.

CLINICAL DATA: Preoperative evaluation for bariatric surgery.

EXAM:
DG UGI W SINGLE CM
TECHNIQUE: Scout radiograph was obtained. Single contrast examination was
performed using thin liquid barium. This exam was performed by Jordania
Triska, and was supervised and interpreted by myself.
FLUOROSCOPY TIME:  Radiation Exposure Index (as provided by the
fluoroscopic device): 1 minute and 18 seconds of low-dose
fluoroscopy
If the device does not provide the exposure index:
Fluoroscopy Time:  59.5 mGy
Number of Acquired Images:  0

[Series 3: t abdomen · 1 of 1 slices shown]
[im 1/1]
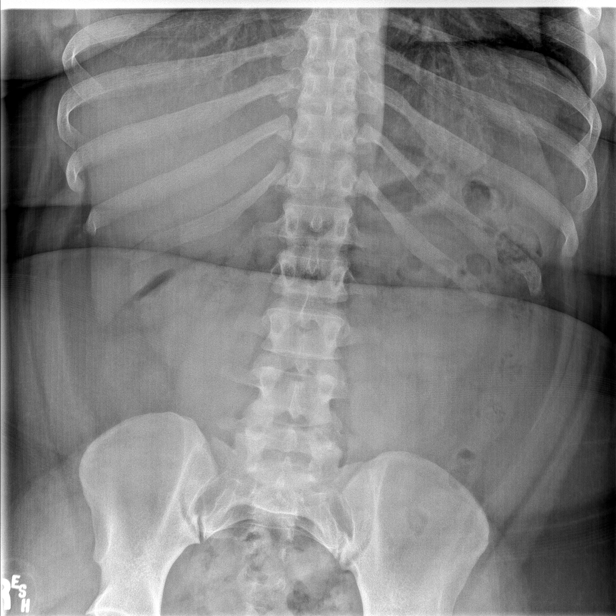

[Series 5: cp_standard · 1 of 118 frames shown (1 of 7)]
[frame 101/118]
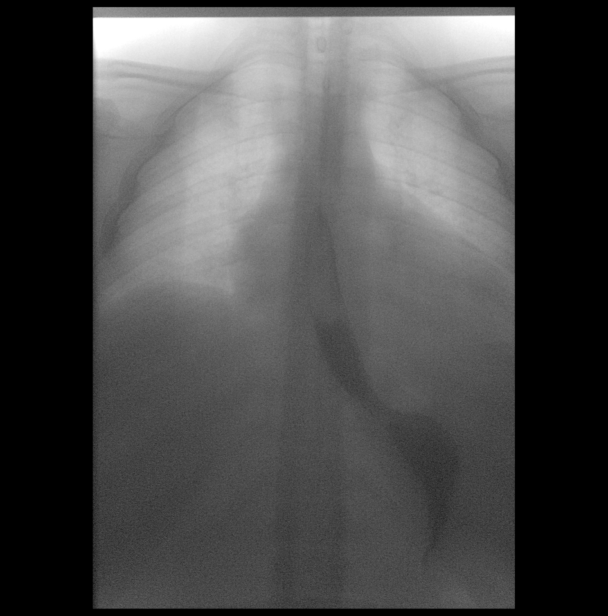

[Series 6: cp_standard · 1 of 88 frames shown (2 of 7)]
[frame 75/88]
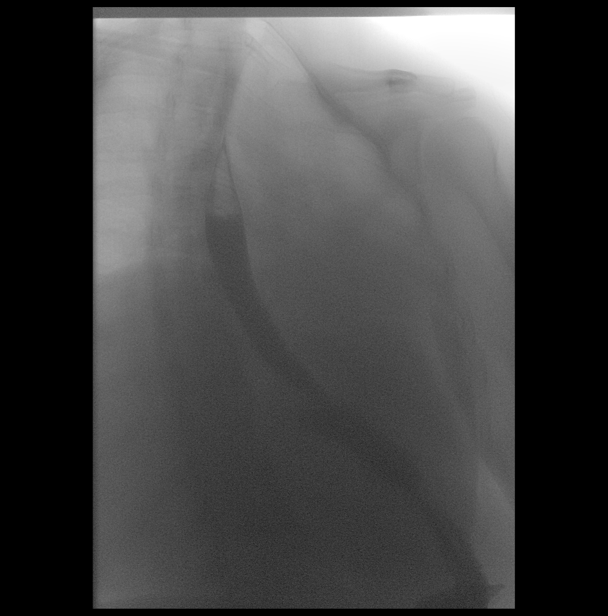

[Series 9: fluoro_barium 2fps_bw · 2 of 11 frames shown (1 of 3)]
[frame 6/11]
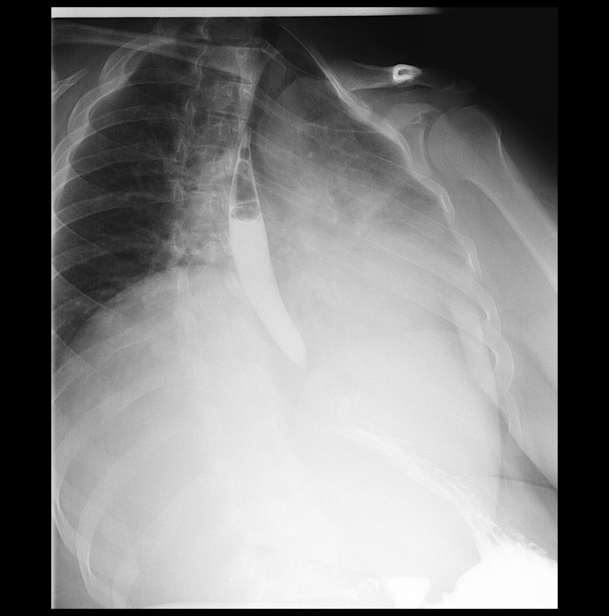
[frame 10/11]
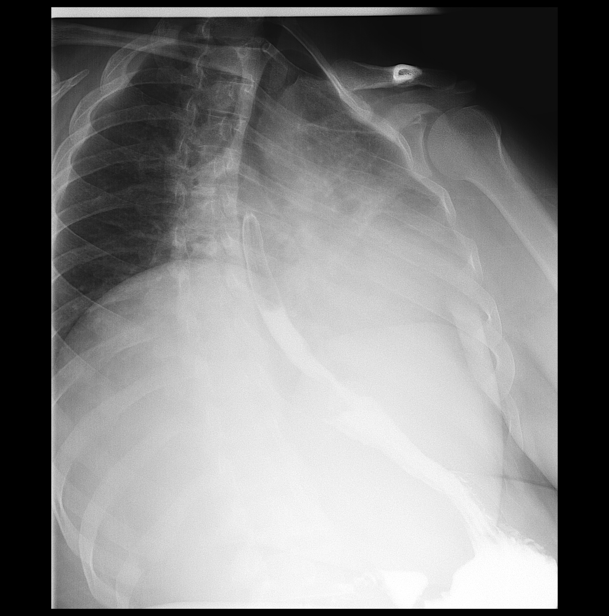

[Series 10: cp_standard · 1 of 143 frames shown (3 of 7)]
[frame 122/143]
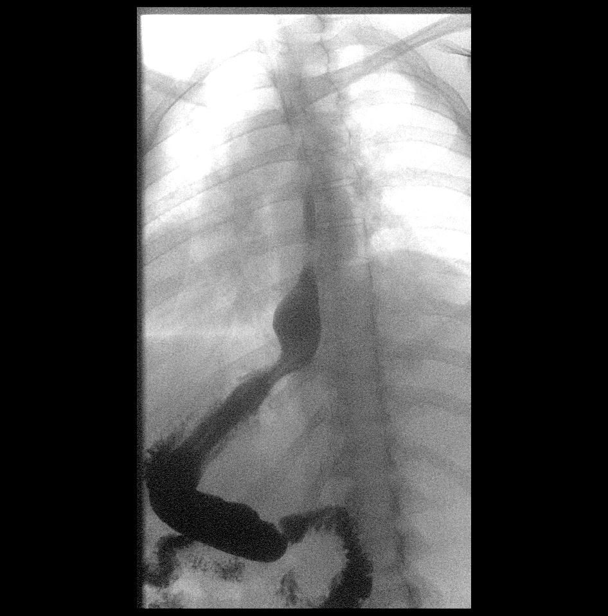

[Series 12: fluoro_barium 2fps_bw · 2 of 16 frames shown (2 of 3)]
[frame 3/16]
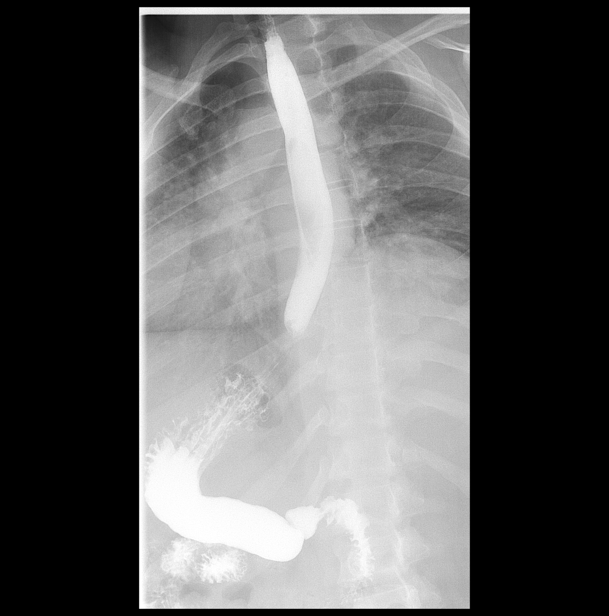
[frame 9/16]
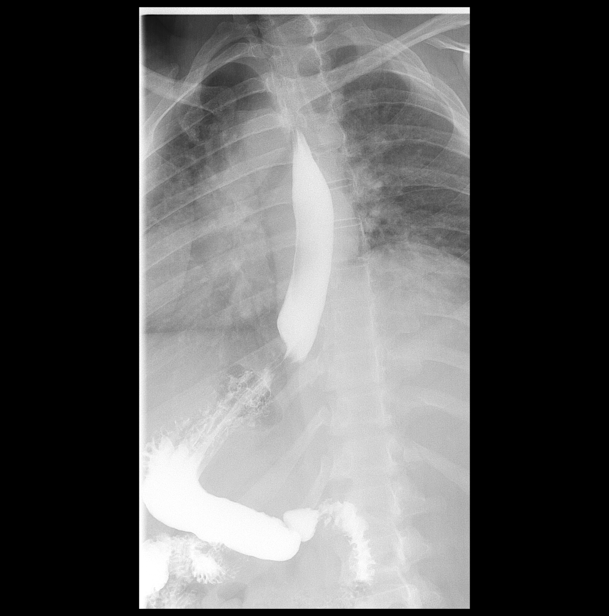

[Series 13: cp_standard · 1 of 144 frames shown (4 of 7)]
[frame 24/144]
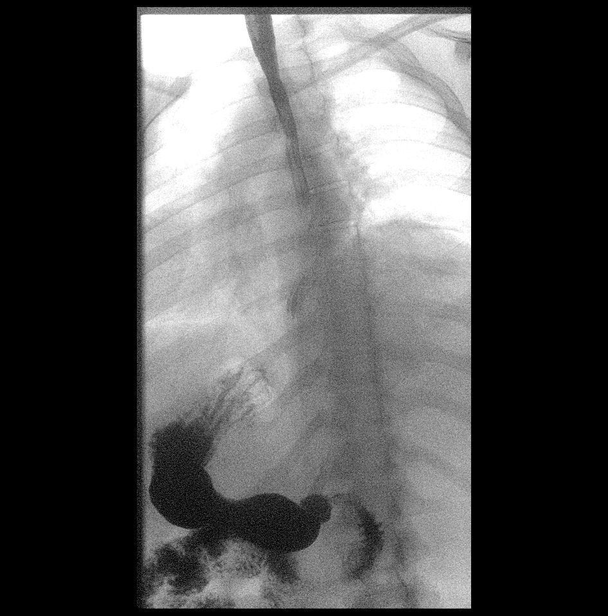

[Series 14: fluoro_barium 2fps_bw · 1 of 9 frames shown (3 of 3)]
[frame 5/9]
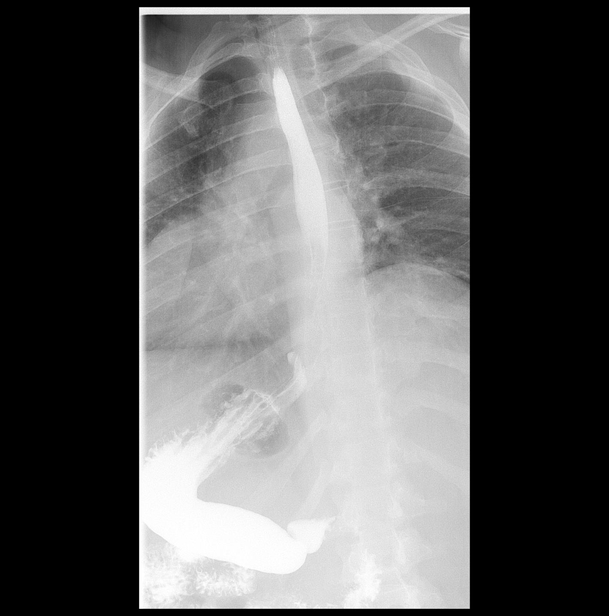

[Series 15: cp_standard · 2 of 42 frames shown (5 of 7)]
[frame 7/42]
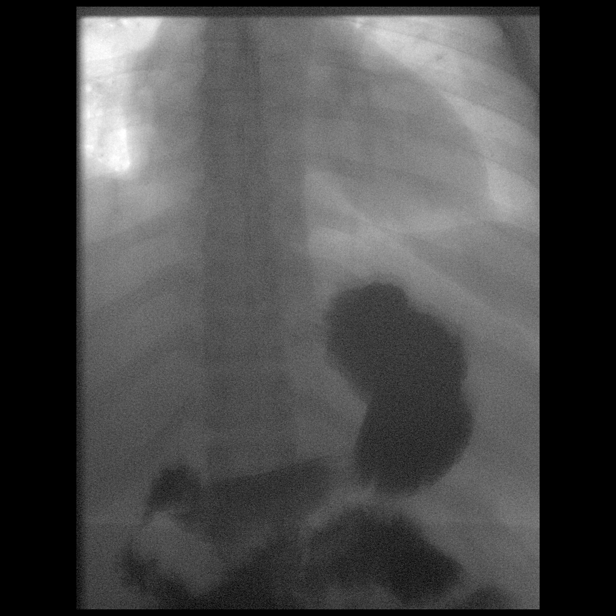
[frame 36/42]
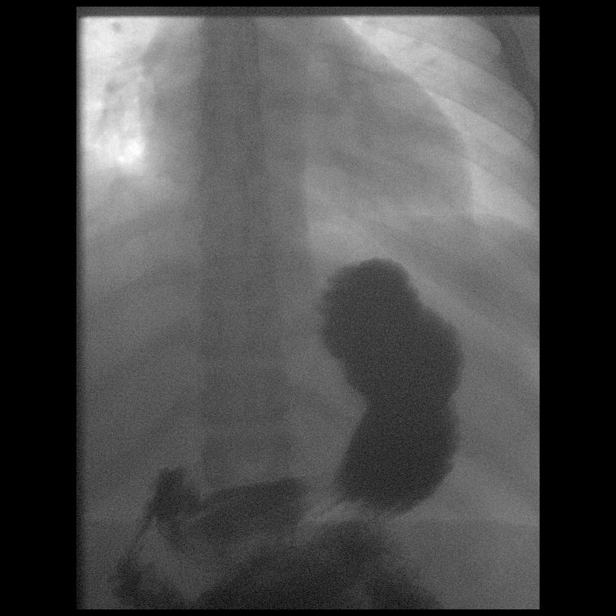

[Series 16: cp_standard · 1 of 101 frames shown (6 of 7)]
[frame 86/101]
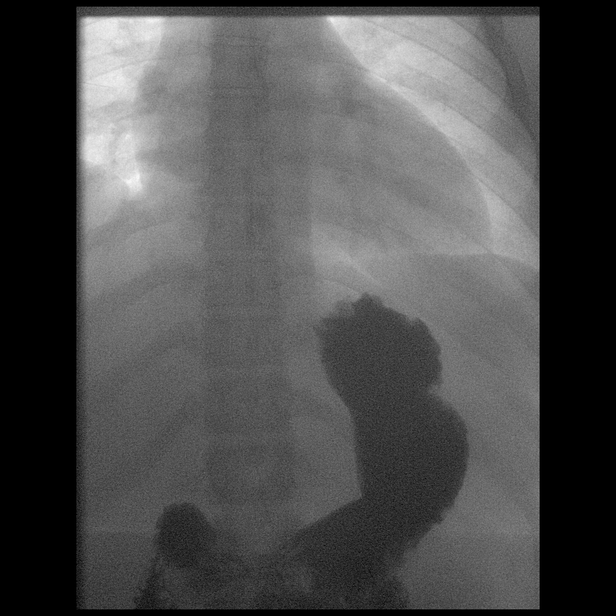

[Series 17: cp_standard · 1 of 73 frames shown (7 of 7)]
[frame 72/73]
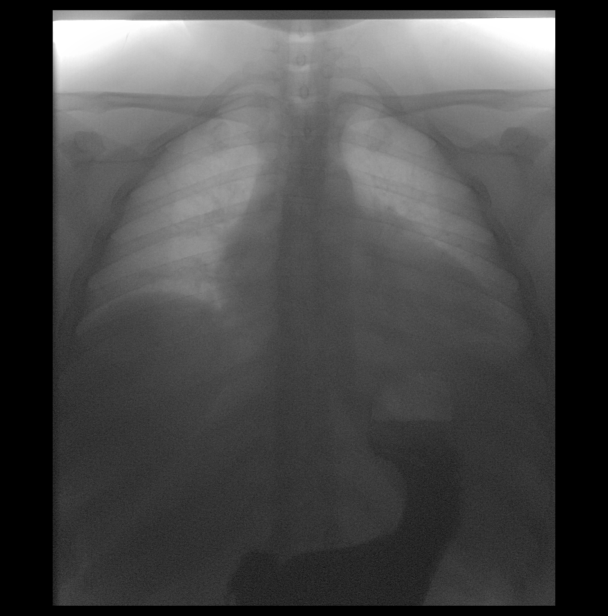

[14 of 24 positions shown; findings below may reference images not displayed]

FINDINGS: The scout abdominal radiograph is unremarkable. The patient
swallowed the barium without difficulty. The esophageal motility is
normal. There is no hiatal hernia, stricture mass or ulceration.

The stomach and duodenum appear normal, without ulceration. The
ligament of [place] is normally positioned.

A 13 mm barium tablet was administered and passed without delay into
the stomach.
IMPRESSION: Unremarkable upper GI series.

## 2022-08-18 ENCOUNTER — Encounter: Payer: Self-pay | Admitting: Skilled Nursing Facility1

## 2022-08-18 ENCOUNTER — Encounter: Payer: BC Managed Care – PPO | Attending: Family Medicine | Admitting: Skilled Nursing Facility1

## 2022-08-18 DIAGNOSIS — E669 Obesity, unspecified: Secondary | ICD-10-CM | POA: Insufficient documentation

## 2022-08-18 NOTE — Progress Notes (Signed)
Bariatric Nutrition Follow-Up Visit Medical Nutrition Therapy    NUTRITION ASSESSMENT    Surgery date: 03/31/2022 Surgery type: Sleeve Gastrectomy   Anthropometrics: Weight: 214.6 lb    Patient's Goal Weight: 170lbs  Clinical: Medical History: pre diabetes Medications and Supplements: albuterol inhaler prn, pantoprazole prn, ibuprofen prn Relevant labs: HbA1C 5.7% 05/07/21 Notable symptoms: decreased stamina Drug allergies: none known Food allergies: none known   Body Composition Scale 04/15/2022 05/27/2022 08/18/2022  Current Body Weight 247.1 234.8 214.3  Total Body Fat % 47.1 44.2 41.8  Visceral Fat '15 13 12  '$ Fat-Free Mass % 52.8 55.7 58.1   Total Body Water % 40.9 42.3 43.5  Muscle-Mass lbs 29.2 31.1 30.9  BMI 46.8 42.8 39  Body Fat Displacement            Torso  lbs 72.2 64.4 55.5         Left Leg  lbs 14.4 12.8 11.1         Right Leg  lbs 14.4 12.8 11.1         Left Arm  lbs 7.2 6.4 5.5         Right Arm   lbs 7.2 6.4 5.5     Lifestyle & Dietary Hx  Pt states she was advised to go get labs drawn but she forgot so she will get those done.  Pt states she was drinking soda but cut it out in September.  Pt states she does go to her therapist weekly. Pt states she is realizing she is a stress eater. Pt states she thinks she will be starting antianxiety medicine.  Pt states she is realizing coffee is an appetite suppressant.    Estimated daily fluid intake: 40-50 oz Estimated daily protein intake: 40 g Supplements:  Current average weekly physical activity: just started walking a mile a day   24-Hr Dietary Recall First Meal: skipped or yogurt and coffee Snack:   Second Meal: chicken and rice Snack:  grapes Third Meal: chicken Snack:  Beverages: water + flavoring, Gatorade zero, vodka, coffee + creamer + coffee, red bull sometimes  Post-Op Goals/ Signs/ Symptoms Using straws: no Drinking while eating: no Chewing/swallowing difficulties: no Changes in  vision: no Changes to mood/headaches: no Hair loss/changes to skin/nails: no Difficulty focusing/concentrating: no Sweating: no Limb weakness: no Dizziness/lightheadedness: no Palpitations: no  Carbonated/caffeinated beverages: no N/V/D/C/Gas: every other day to every 2 days Abdominal pain: no Dumping syndrome: no    NUTRITION DIAGNOSIS  Overweight/obesity ([place]-3.3) related to past poor dietary habits and physical inactivity as evidenced by completed bariatric surgery and following dietary guidelines for continued weight loss and healthy nutrition status.     NUTRITION INTERVENTION Nutrition counseling (C-1) and education (E-2) to facilitate bariatric surgery goals, including: Diet advancement to the next phase (phase 4) now including non starchy vegetables The importance of consuming adequate calories as well as certain nutrients daily due to the body's need for essential vitamins, minerals, and fats The importance of daily physical activity and to reach a goal of at least 150 minutes of moderate to vigorous physical activity weekly (or as directed by their physician) due to benefits such as increased musculature and improved lab values The importance of intuitive eating specifically learning hunger-satiety cues and understanding the importance of learning a new body: The importance of mindful eating to avoid grazing behaviors  Encouraged patient to honor their body's internal hunger and fullness cues.  Throughout the day, check in mentally and rate hunger. Stop eating when  satisfied not full regardless of how much food is left on the plate.  Get more if still hungry 20-30 minutes later.  The key is to honor satisfaction so throughout the meal, rate fullness factor and stop when comfortably satisfied not physically full. The key is to honor hunger and fullness without any feelings of guilt or shame.  Pay attention to what the internal cues are, rather than any external factors. This will  enhance the confidence you have in listening to your own body and following those internal cues enabling you to increase how often you eat when you are hungry not out of appetite and stop when you are satisfied not full.  Encouraged pt to continue to eat balanced meals inclusive of non starchy vegetables 2 times a day 7 days a week Encouraged pt to choose lean protein sources: limiting beef, pork, sausage, hotdogs, and lunch meat Encourage pt to choose healthy fats such as plant based limiting animal fats Encouraged pt to continue to drink a minium 64 fluid ounces with half being plain water to satisfy proper hydration   Goals: Eat breakfast every day There are no good or bad foods Work on your mindful meals sheet  Handouts Previously Provided Include  Phase 7  Learning Style & Readiness for Change Teaching method utilized: Visual & Auditory  Demonstrated degree of understanding via: Teach Back  Readiness Level: action Barriers to learning/adherence to lifestyle change: fear/anxiety  RD's Notes for Next Visit Assess adherence to pt chosen goals    MONITORING & EVALUATION Dietary intake, weekly physical activity, body weight  Next Steps Patient is to follow-up in 1 month

## 2022-08-21 ENCOUNTER — Encounter: Payer: Self-pay | Admitting: Hematology

## 2022-09-24 ENCOUNTER — Ambulatory Visit: Payer: BC Managed Care – PPO | Admitting: Skilled Nursing Facility1

## 2022-10-01 ENCOUNTER — Encounter: Payer: Self-pay | Admitting: Internal Medicine

## 2023-01-19 ENCOUNTER — Encounter: Payer: Self-pay | Admitting: Nurse Practitioner

## 2023-01-19 ENCOUNTER — Ambulatory Visit (INDEPENDENT_AMBULATORY_CARE_PROVIDER_SITE_OTHER): Payer: BC Managed Care – PPO | Admitting: Nurse Practitioner

## 2023-01-19 VITALS — BP 122/78 | HR 78 | Ht 62.0 in | Wt 210.6 lb

## 2023-01-19 DIAGNOSIS — D1722 Benign lipomatous neoplasm of skin and subcutaneous tissue of left arm: Secondary | ICD-10-CM | POA: Insufficient documentation

## 2023-01-19 DIAGNOSIS — E782 Mixed hyperlipidemia: Secondary | ICD-10-CM | POA: Diagnosis not present

## 2023-01-19 DIAGNOSIS — R21 Rash and other nonspecific skin eruption: Secondary | ICD-10-CM | POA: Insufficient documentation

## 2023-01-19 DIAGNOSIS — Z30011 Encounter for initial prescription of contraceptive pills: Secondary | ICD-10-CM | POA: Diagnosis not present

## 2023-01-19 DIAGNOSIS — Z0000 Encounter for general adult medical examination without abnormal findings: Secondary | ICD-10-CM | POA: Diagnosis not present

## 2023-01-19 DIAGNOSIS — D509 Iron deficiency anemia, unspecified: Secondary | ICD-10-CM | POA: Diagnosis not present

## 2023-01-19 DIAGNOSIS — E559 Vitamin D deficiency, unspecified: Secondary | ICD-10-CM

## 2023-01-19 HISTORY — DX: Encounter for initial prescription of contraceptive pills: Z30.011

## 2023-01-19 HISTORY — DX: Rash and other nonspecific skin eruption: R21

## 2023-01-19 LAB — LIPID PANEL

## 2023-01-19 LAB — POCT URINE PREGNANCY: Preg Test, Ur: NEGATIVE

## 2023-01-19 MED ORDER — CLOTRIMAZOLE-BETAMETHASONE 1-0.05 % EX CREA: 1.0000 | TOPICAL_CREAM | Freq: Every day | CUTANEOUS | 0 refills | Status: DC

## 2023-01-19 MED ORDER — DOXYCYCLINE HYCLATE 100 MG PO TABS: 100.0000 mg | ORAL_TABLET | Freq: Two times a day (BID) | ORAL | 0 refills | Status: DC

## 2023-01-19 MED ORDER — DROSPIRENONE-ETHINYL ESTRADIOL 3-0.02 MG PO TABS: 1.0000 | ORAL_TABLET | Freq: Every day | ORAL | 3 refills | Status: DC

## 2023-01-19 NOTE — Assessment & Plan Note (Signed)
The patient reports experiencing irregular periods for the past couple of months, which are not heavy. Contraceptive options discussed today.  Plan: - Prescribe a combination of estrogen and progesterone birth control to help regulate menstrual cycles. - Educate the patient on proper use and potential side effects of the medication. - Schedule follow-up as needed.

## 2023-01-19 NOTE — Assessment & Plan Note (Signed)
The patient has a history of anemia, previously managed with iron infusions. Plan: - Order lab work to check hemoglobin and iron levels. - Plan to follow up with the patient to discuss lab results and adjust the treatment plan if necessary.

## 2023-01-19 NOTE — Assessment & Plan Note (Signed)
The patient presents with an erythematous, annular, warm rash on the left arm that has been present for a couple of weeks. There are no signs of fungal infection with woods lamp. No drainage. The condition may be an inflammatory response or have a bacterial component. At this time, etiology is unclear.  Plan: - Prescribe Doxycycline for a short course to address a potential bacterial infection given the warmth present to the area.  - Prescribe Clotrimazole-Betamethasone for topical treatment to assist with itching and for treatment. - Instruct the patient to follow up if the rash does not improve or worsens.

## 2023-01-19 NOTE — Assessment & Plan Note (Signed)

## 2023-01-19 NOTE — Progress Notes (Signed)
Worthy Keeler, DNP, AGNP-c [place], [place] [address] Main Office [phone]  BP 122/78   Pulse 78   Ht '5\' 2"'$  (1.575 m)   Wt 210 lb 9.6 oz (95.5 kg)   LMP 12/21/2022   BMI 38.52 kg/m    Subjective:    Patient ID: Kathryn Velazquez, female    DOB: 02-15-1986, 37 y.o.   MRN: [id number]  HPI: Kathryn Velazquez is a 37 y.o. female presenting on 01/19/2023 for comprehensive medical examination.   Current medical concerns include: Kathryn Velazquez presents today for a routine physical examination. She reports a rash on her left arm that first appeared a couple of weeks ago. The rash is described as intermittently itchy, with no constant discomfort. She denies experiencing any known insect bites and states that there are no similar rashes present on other parts of her body. According to Kathryn Velazquez, the rash has remained the same size since it first appeared.  She has a medical history significant for anemia, for which she has received iron infusions at [facility]. Kathryn Velazquez also recalls having gestational diabetes during her pregnancy and experiencing palpitations, which she attributes to the same period.  Kathryn Velazquez mentions that her menstrual periods have been irregular over the past few months, though they have not been heavy. She is currently contemplating birth control options but expresses uncertainty regarding potential side effects. She is married and has two children, ages 62 years and 23 months.   Left medial arm- rash 4cm x 5 cm Left anterior arm - lipoma  Prevnar 13: Prevnar 13 N/A for this patient IMMUNIZATIONS:   Flu: Flu vaccine completed elsewhere this season Prevnar 13: Prevnar 13 N/A for this patient Prevnar 20: Prevnar 20 N/A for this patient Pneumovax 23: Pneumovax 23 N/A for this patient Vac Shingrix: Shingrix N/A for this patient HPV: HPV N/A for this patient Tetanus: Tetanus completed in the last 10 years  HEALTH  MAINTENANCE: Pap Smear HM Status: is due and to be scheduled by patient for later completion Mammogram HM Status: is not applicable for this patient Colon Cancer Screening HM Status: is not applicable for this patient Bone Density HM Status: is not applicable for this patient STI Testing HM Status: was completed today  She reports regular vision exams q1-5y: Yes  She reports regular dental exams q 28m  Yes  The patient eats a regular, healthy diet. She endorses exercise and/or activity of:  Nothing routine  She currently: Marital Status: married Living situation: with family Sexual: monogamous Occupation: tPharmacist, hospital  Pertinent items are noted in HPI.  Most Recent Depression Screen:     01/19/2023    2:55 PM 01/01/2022   11:27 AM 11/09/2018    8:31 AM 06/17/2018   11:12 AM  Depression screen PHQ 2/9  Decreased Interest 0 0 0 3  Down, Depressed, Hopeless 0 0 0 3  PHQ - 2 Score 0 0 0 6  Altered sleeping    3  Tired, decreased energy    3  Change in appetite    3  Feeling bad or failure about yourself     0  Trouble concentrating    3  Moving slowly or fidgety/restless    0  Suicidal thoughts    0  PHQ-9 Score    18  Difficult doing work/chores    Not difficult at all   Most Recent Anxiety Screen:     06/17/2018   11:14 AM  GAD 7 : Generalized  Anxiety Score  Nervous, Anxious, on Edge 2  Control/stop worrying 2  Worry too much - different things 2  Trouble relaxing 2  Restless 0  Easily annoyed or irritable 2  Afraid - awful might happen 2  Total GAD 7 Score 12  Anxiety Difficulty Not difficult at all   Most Recent Fall Screen:    01/19/2023    2:54 PM 01/01/2022   11:27 AM 11/09/2018    8:31 AM 06/17/2018   11:12 AM  [place] in the past year? 0 0 0 No  Number falls in past yr: 0  0   Injury with Fall? 0  0   Risk for fall due to : No Fall Risks     Follow up Falls evaluation completed       Past medical history, surgical history, medications, allergies,  family history and social history reviewed with patient today and changes made to appropriate areas of the chart.  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 '75mg'$    lamoTRIgine (LAMICTAL) 100 MG tablet Take 100 mg by mouth daily. '125mg'$    pantoprazole (PROTONIX) 40 MG tablet Take 1 tablet (40 mg total) by mouth daily.   No current facility-administered medications on file prior to visit.   Surgical History:  Past Surgical History:  Procedure Laterality Date   CESAREAN SECTION     CESAREAN SECTION N/A 03/04/2021   Procedure: CESAREAN SECTION;  Surgeon: Servando Salina, MD;  Location: [facility] ORS;  Service: Obstetrics;  Laterality: N/A;   UPPER GI ENDOSCOPY N/A 03/31/2022   Procedure: UPPER GI ENDOSCOPY;  Surgeon: Kieth Brightly, Arta Bruce, MD;  Location: [facility] ORS;  Service: General;  Laterality: N/A;   WISDOM TOOTH EXTRACTION     Allergies:  No Known Allergies Family History:  Family History  Problem Relation Age of Onset   Aneurysm Mother    Alcohol abuse Mother    Cerebral aneurysm Mother    Hypertension Father    Diabetes Brother        Objective:    BP 122/78   Pulse 78   Ht '5\' 2"'$  (1.575 m)   Wt 210 lb 9.6 oz (95.5 kg)   LMP 12/21/2022   BMI 38.52 kg/m   Wt Readings from Last 3 Encounters:  01/19/23 210 lb 9.6 oz (95.5 kg)  08/18/22 214 lb 4.8 oz (97.2 kg)  05/27/22 234 lb 12.8 oz (106.5 kg)    Physical Exam Vitals and nursing note reviewed.  Constitutional:      General: She is not in acute distress.    Appearance: Normal  appearance.  HENT:     Head: Normocephalic and atraumatic.     Right Ear: Hearing, tympanic membrane, ear canal and external ear normal.     Left Ear: Hearing, tympanic membrane, ear canal and external ear normal.     Nose: Nose normal.     Right Sinus: No maxillary sinus tenderness or frontal sinus tenderness.     Left Sinus: No maxillary sinus tenderness or frontal sinus  tenderness.     Mouth/Throat:     Lips: Pink.     Mouth: Mucous membranes are moist.     Pharynx: Oropharynx is clear.  Eyes:     General: Lids are normal. Vision grossly intact.     Extraocular Movements: Extraocular movements intact.     Conjunctiva/sclera: Conjunctivae normal.     Pupils: Pupils are equal, round, and reactive to light.     Funduscopic exam:    Right eye: Red reflex present.        Left eye: Red reflex present.    Visual Fields: Right eye visual fields normal and left eye visual fields normal.  Neck:     Thyroid: No thyromegaly.     Vascular: No carotid bruit.  Cardiovascular:     Rate and Rhythm: Normal rate and regular rhythm.     Chest Wall: PMI is not displaced.     Pulses: Normal pulses.          Dorsalis pedis pulses are 2+ on the right side and 2+ on the left side.       Posterior tibial pulses are 2+ on the right side and 2+ on the left side.     Heart sounds: Normal heart sounds. No murmur heard. Pulmonary:     Effort: Pulmonary effort is normal. No respiratory distress.     Breath sounds: Normal breath sounds.  Abdominal:     General: Abdomen is flat. Bowel sounds are normal. There is no distension.     Palpations: Abdomen is soft. There is no hepatomegaly, splenomegaly or mass.     Tenderness: There is no abdominal tenderness. There is no right CVA tenderness, left CVA tenderness, guarding or rebound.  Musculoskeletal:        General: Normal range of motion.     Cervical back: Full passive range of motion without pain, normal range of motion and neck supple. No tenderness.      Right lower leg: No edema.     Left lower leg: No edema.  Feet:     Left foot:     Toenail Condition: Left toenails are normal.  Lymphadenopathy:     Cervical: No cervical adenopathy.     Upper Body:     Right upper body: No supraclavicular adenopathy.     Left upper body: No supraclavicular adenopathy.  Skin:    General: Skin is warm and dry.     Capillary Refill: Capillary refill takes less than 2 seconds.     Findings: Erythema and rash present.     Nails: There is no clubbing.     Comments: 5cm annular erythematous ring noted on the medial surface of the left upper arm. Area warm to touch with no fluctuance or edema present.   Soft, mobile mass consistent with lipoma noted on anterior surface of left upper extremity proximal to shoulder.   Neurological:     General: No focal deficit present.     Mental Status: She is alert and oriented to person, place, and time.     GCS: GCS eye subscore is 4. GCS verbal subscore is 5. GCS motor subscore is 6.     Sensory: Sensation is intact.     Motor: Motor function is intact.     Coordination: Coordination is intact.     Gait: Gait is intact.     Deep Tendon Reflexes: Reflexes are normal and symmetric.  Psychiatric:        Attention and Perception: Attention  normal.        Mood and Affect: Mood normal.        Speech: Speech normal.        Behavior: Behavior normal. Behavior is cooperative.        Thought Content: Thought content normal.        Cognition and Memory: Cognition and memory normal.        Judgment: Judgment normal.     Results for orders placed or performed in visit on 01/19/23  POCT urine pregnancy  Result Value Ref Range   Preg Test, Ur Negative Negative         Assessment & Plan:   Problem List Items Addressed This Visit     Morbid obesity ([place])   Relevant Medications   drospirenone-ethinyl estradiol (YAZ) 3-0.02 MG tablet   Other Relevant Orders   CBC with Differential/Platelet   Comprehensive metabolic  panel   Hemoglobin A1c   Iron, TIBC and Ferritin Panel   Lipid panel   TSH   VITAMIN D 25 Hydroxy (Vit-D Deficiency, Fractures)   STI Profile, CT/NG/TV   Iron deficiency anemia    The patient has a history of anemia, previously managed with iron infusions. Plan: - Order lab work to check hemoglobin and iron levels. - Plan to follow up with the patient to discuss lab results and adjust the treatment plan if necessary.      Relevant Medications   drospirenone-ethinyl estradiol (YAZ) 3-0.02 MG tablet   Other Relevant Orders   CBC with Differential/Platelet   Comprehensive metabolic panel   Hemoglobin A1c   Iron, TIBC and Ferritin Panel   Lipid panel   TSH   VITAMIN D 25 Hydroxy (Vit-D Deficiency, Fractures)   STI Profile, CT/NG/TV   Encounter for annual physical exam - Primary    CPE today with no abnormalities noted on exam.  Labs pending. Will make changes as necessary based on results.  Review of HM activities and recommendations discussed and provided on AVS Anticipatory guidance, diet, and exercise recommendations provided.  Medications, allergies, and hx reviewed and updated as necessary.  Plan to f/u with CPE in 1 year or sooner for acute/chronic health needs as directed.        Encounter for initial prescription of contraceptive pills    The patient reports experiencing irregular periods for the past couple of months, which are not heavy. Contraceptive options discussed today.  Plan: - Prescribe a combination of estrogen and progesterone birth control to help regulate menstrual cycles. - Educate the patient on proper use and potential side effects of the medication. - Schedule follow-up as needed.      Relevant Medications   drospirenone-ethinyl estradiol (YAZ) 3-0.02 MG tablet   Other Relevant Orders   STI Profile, CT/NG/TV   POCT urine pregnancy (Completed)   Rash and nonspecific skin eruption    The patient presents with an erythematous, annular, warm rash on  the left arm that has been present for a couple of weeks. There are no signs of fungal infection with woods lamp. No drainage. The condition may be an inflammatory response or have a bacterial component. At this time, etiology is unclear.  Plan: - Prescribe Doxycycline for a short course to address a potential bacterial infection given the warmth present to the area.  - Prescribe Clotrimazole-Betamethasone for topical treatment to assist with itching and for treatment. - Instruct the patient to follow up if the rash does not improve or worsens.  Relevant Medications   clotrimazole-betamethasone (LOTRISONE) cream   doxycycline (VIBRA-TABS) 100 MG tablet   Lipoma of left upper extremity    Assessment reveals a small, mobile lipoma on the left arm. The patient reports no pain or discomfort. Plan: - No intervention is indicated at this time. - Advise the patient to monitor for any changes in size or discomfort.      Mixed hyperlipidemia   Relevant Medications   drospirenone-ethinyl estradiol (YAZ) 3-0.02 MG tablet   Other Relevant Orders   CBC with Differential/Platelet   Comprehensive metabolic panel   Hemoglobin A1c   Iron, TIBC and Ferritin Panel   Lipid panel   TSH   VITAMIN D 25 Hydroxy (Vit-D Deficiency, Fractures)   STI Profile, CT/NG/TV   Other Visit Diagnoses     Health care maintenance       Relevant Medications   drospirenone-ethinyl estradiol (YAZ) 3-0.02 MG tablet   Other Relevant Orders   CBC with Differential/Platelet   Comprehensive metabolic panel   Hemoglobin A1c   Iron, TIBC and Ferritin Panel   Lipid panel   TSH   VITAMIN D 25 Hydroxy (Vit-D Deficiency, Fractures)   STI Profile, CT/NG/TV          Follow up plan: Return in about 1 year (around 01/19/2024).  NEXT PREVENTATIVE PHYSICAL DUE IN 1 YEAR.  PATIENT COUNSELING PROVIDED FOR ALL ADULT PATIENTS:  Consume a well balanced diet low in saturated fats, cholesterol, and moderation in  carbohydrates.   This can be as simple as monitoring portion sizes and cutting back on sugary beverages such as soda and juice to start with.    Daily water consumption of at least 64 ounces.  Physical activity at least 180 minutes per week, if just starting out.   This can be as simple as taking the stairs instead of the elevator and walking 2-3 laps around the office  purposefully every day.   STD protection, partner selection, and regular testing if high risk.  Limited consumption of alcoholic beverages if alcohol is consumed.  For women, I recommend no more than 7 alcoholic beverages per week, spread out throughout the week.  Avoid "binge" drinking or consuming large quantities of alcohol in one setting.   Please let me know if you feel you may need help with reduction or quitting alcohol consumption.   Avoidance of nicotine, if used.  Please let me know if you feel you may need help with reduction or quitting nicotine use.   Daily mental health attention.  This can be in the form of 5 minute daily meditation, prayer, journaling, yoga, reflection, etc.   Purposeful attention to your emotions and mental state can significantly improve your overall wellbeing and Health.  Please know that I am here to help you with all of your health care goals and am happy to work with you to find a solution that works best for you.  The greatest advice I have received with any changes in life are to take it one step at a time, that even means if all you can focus on is the next 60 seconds, then do that and celebrate your victories.  With any changes in life, you will have set backs, and that is OK. The important thing to remember is, if you have a set back, it is not a failure, it is an opportunity to try again!  Health Maintenance Recommendations Screening Testing Mammogram Every 1 -2 years based on history and risk factors Starting  at age 22 Pap Smear Ages 21-39 every 3 years Ages 81-65 every 5  years with HPV testing More frequent testing may be required based on results and history Colon Cancer Screening Every 1-10 years based on test performed, risk factors, and history Starting at age 60 Bone Density Screening Every 2-10 years based on history Starting at age 10 for women Recommendations for men differ based on medication usage, history, and risk factors AAA Screening One time ultrasound Men 29-55 years old who have every smoked Lung Cancer Screening Low Dose Lung CT every 12 months Age 53-80 years with a 30 pack-year smoking history who still smoke or who have quit within the last 15 years  Screening Labs Routine  Labs: Complete Blood Count (CBC), Complete Metabolic Panel (CMP), Cholesterol (Lipid Panel) Every 6-12 months based on history and medications May be recommended more frequently based on current conditions or previous results Hemoglobin A1c Lab Every 3-12 months based on history and previous results Starting at age 61 or earlier with diagnosis of diabetes, high cholesterol, BMI >26, and/or risk factors Frequent monitoring for patients with diabetes to ensure blood sugar control Thyroid Panel (TSH w/ T3 & T4) Every 6 months based on history, symptoms, and risk factors May be repeated more often if on medication HIV One time testing for all patients 14 and older May be repeated more frequently for patients with increased risk factors or exposure Hepatitis C One time testing for all patients 19 and older May be repeated more frequently for patients with increased risk factors or exposure Gonorrhea, Chlamydia Every 12 months for all sexually active persons 13-24 years Additional monitoring may be recommended for those who are considered high risk or who have symptoms PSA Men 64-75 years old with risk factors Additional screening may be recommended from age 67-69 based on risk factors, symptoms, and history  Vaccine Recommendations Tetanus Booster All  adults every 10 years Flu Vaccine All patients 6 months and older every year COVID Vaccine All patients 12 years and older Initial dosing with booster May recommend additional booster based on age and health history HPV Vaccine 2 doses all patients age 23-26 Dosing may be considered for patients over 26 Shingles Vaccine (Shingrix) 2 doses all adults 3 years and older Pneumonia (Pneumovax 23) All adults 9 years and older May recommend earlier dosing based on health history Pneumonia (Prevnar 66) All adults 26 years and older Dosed 1 year after Pneumovax 23  Additional Screening, Testing, and Vaccinations may be recommended on an individualized basis based on family history, health history, risk factors, and/or exposure.

## 2023-01-19 NOTE — Assessment & Plan Note (Signed)
Assessment reveals a small, mobile lipoma on the left arm. The patient reports no pain or discomfort. Plan: - No intervention is indicated at this time. - Advise the patient to monitor for any changes in size or discomfort.

## 2023-01-22 LAB — LIPID PANEL
Chol/HDL Ratio: 3.1 ratio (ref 0.0–4.4)
Cholesterol, Total: 175 mg/dL (ref 100–199)
HDL: 57 mg/dL (ref >39)
LDL Chol Calc (NIH): 109 mg/dL — ABNORMAL HIGH (ref 0–99)
Triglycerides: 46 mg/dL (ref 0–149)
VLDL Cholesterol Cal: 9 mg/dL (ref 5–40)

## 2023-01-22 LAB — CBC WITH DIFFERENTIAL/PLATELET
Basophils Absolute: 0 10*3/uL (ref 0.0–0.2)
Basos: 0 %
EOS (ABSOLUTE): 0.1 10*3/uL (ref 0.0–0.4)
Eos: 2 %
Hematocrit: 38.7 % (ref 34.0–46.6)
Hemoglobin: 12.7 g/dL (ref 11.1–15.9)
Immature Grans (Abs): 0 10*3/uL (ref 0.0–0.1)
Immature Granulocytes: 0 %
Lymphocytes Absolute: 2.2 10*3/uL (ref 0.7–3.1)
Lymphs: 33 %
MCH: 26.5 pg — ABNORMAL LOW (ref 26.6–33.0)
MCHC: 32.8 g/dL (ref 31.5–35.7)
MCV: 81 fL (ref 79–97)
Monocytes Absolute: 0.5 10*3/uL (ref 0.1–0.9)
Monocytes: 8 %
Neutrophils Absolute: 3.8 10*3/uL (ref 1.4–7.0)
Neutrophils: 57 %
Platelets: 481 10*3/uL — ABNORMAL HIGH (ref 150–450)
RBC: 4.8 x10E6/uL (ref 3.77–5.28)
RDW: 14.2 % (ref 11.7–15.4)
WBC: 6.7 10*3/uL (ref 3.4–10.8)

## 2023-01-22 LAB — COMPREHENSIVE METABOLIC PANEL
ALT: 10 IU/L (ref 0–32)
AST: 14 IU/L (ref 0–40)
Albumin/Globulin Ratio: 1.5 (ref 1.2–2.2)
Albumin: 4.5 g/dL (ref 3.9–4.9)
Alkaline Phosphatase: 128 IU/L — ABNORMAL HIGH (ref 44–121)
BUN/Creatinine Ratio: 17 (ref 9–23)
BUN: 11 mg/dL (ref 6–20)
Bilirubin Total: 0.2 mg/dL (ref 0.0–1.2)
CO2: 22 mmol/L (ref 20–29)
Calcium: 9.5 mg/dL (ref 8.7–10.2)
Chloride: 104 mmol/L (ref 96–106)
Creatinine, Ser: 0.65 mg/dL (ref 0.57–1.00)
Globulin, Total: 3.1 g/dL (ref 1.5–4.5)
Glucose: 82 mg/dL (ref 70–99)
Potassium: 4.3 mmol/L (ref 3.5–5.2)
Sodium: 140 mmol/L (ref 134–144)
Total Protein: 7.6 g/dL (ref 6.0–8.5)
eGFR: 118 mL/min/{1.73_m2} (ref >59)

## 2023-01-22 LAB — STI PROFILE, CT/NG/TV
Chlamydia by NAA: NEGATIVE
Gonococcus by NAA: NEGATIVE
HCV Ab: NONREACTIVE
HIV Screen 4th Generation wRfx: NONREACTIVE
Hep B Core Total Ab: NEGATIVE
Hep B Surface Ab, Qual: REACTIVE
Hepatitis B Surface Ag: NEGATIVE
RPR Ser Ql: NONREACTIVE
Trich vag by NAA: NEGATIVE

## 2023-01-22 LAB — HEMOGLOBIN A1C
Est. average glucose Bld gHb Est-mCnc: 97 mg/dL
Hgb A1c MFr Bld: 5 % (ref 4.8–5.6)

## 2023-01-22 LAB — HCV INTERPRETATION

## 2023-01-22 LAB — VITAMIN D 25 HYDROXY (VIT D DEFICIENCY, FRACTURES): Vit D, 25-Hydroxy: 10.6 ng/mL — ABNORMAL LOW (ref 30.0–100.0)

## 2023-01-22 LAB — IRON,TIBC AND FERRITIN PANEL
Ferritin: 46 ng/mL (ref 15–150)
Iron Saturation: 21 % (ref 15–55)
Iron: 75 ug/dL (ref 27–159)
Total Iron Binding Capacity: 352 ug/dL (ref 250–450)
UIBC: 277 ug/dL (ref 131–425)

## 2023-01-22 LAB — TSH: TSH: 1.24 u[IU]/mL (ref 0.450–4.500)

## 2023-01-23 MED ORDER — VITAMIN D3 1.25 MG (50000 UT) PO TABS: 1.0000 | ORAL_TABLET | ORAL | 1 refills | Status: AC

## 2023-01-23 NOTE — Addendum Note (Signed)
Addended by: Delcia Spitzley, Clarise Cruz E on: 01/23/2023 06:09 PM   Modules accepted: Orders

## 2023-04-04 ENCOUNTER — Emergency Department (HOSPITAL_COMMUNITY)
Admission: EM | Admit: 2023-04-04 | Discharge: 2023-04-05 | Disposition: A | Discharge status: Home / Self Care | Payer: BC Managed Care – PPO | Attending: Emergency Medicine | Admitting: Emergency Medicine

## 2023-04-04 ENCOUNTER — Other Ambulatory Visit: Payer: Self-pay

## 2023-04-04 ENCOUNTER — Encounter: Payer: Self-pay | Admitting: Hematology

## 2023-04-04 DIAGNOSIS — Y92481 Parking lot as the place of occurrence of the external cause: Secondary | ICD-10-CM | POA: Diagnosis not present

## 2023-04-04 DIAGNOSIS — S199XXA Unspecified injury of neck, initial encounter: Secondary | ICD-10-CM | POA: Diagnosis present

## 2023-04-04 DIAGNOSIS — S161XXA Strain of muscle, fascia and tendon at neck level, initial encounter: Secondary | ICD-10-CM | POA: Diagnosis not present

## 2023-04-04 DIAGNOSIS — R911 Solitary pulmonary nodule: Secondary | ICD-10-CM | POA: Diagnosis not present

## 2023-04-04 DIAGNOSIS — N6310 Unspecified lump in the right breast, unspecified quadrant: Secondary | ICD-10-CM | POA: Diagnosis not present

## 2023-04-04 DIAGNOSIS — S20212A Contusion of left front wall of thorax, initial encounter: Secondary | ICD-10-CM | POA: Diagnosis not present

## 2023-04-04 DIAGNOSIS — M25511 Pain in right shoulder: Secondary | ICD-10-CM | POA: Insufficient documentation

## 2023-04-04 DIAGNOSIS — M25561 Pain in right knee: Secondary | ICD-10-CM | POA: Diagnosis not present

## 2023-04-04 DIAGNOSIS — S60511A Abrasion of right hand, initial encounter: Secondary | ICD-10-CM | POA: Insufficient documentation

## 2023-04-04 DIAGNOSIS — M25512 Pain in left shoulder: Secondary | ICD-10-CM | POA: Insufficient documentation

## 2023-04-04 DIAGNOSIS — M25562 Pain in left knee: Secondary | ICD-10-CM | POA: Insufficient documentation

## 2023-04-04 DIAGNOSIS — M25572 Pain in left ankle and joints of left foot: Secondary | ICD-10-CM | POA: Diagnosis not present

## 2023-04-04 DIAGNOSIS — R519 Headache, unspecified: Secondary | ICD-10-CM | POA: Insufficient documentation

## 2023-04-04 DIAGNOSIS — T07XXXA Unspecified multiple injuries, initial encounter: Secondary | ICD-10-CM
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
## 2023-04-05 ENCOUNTER — Emergency Department (HOSPITAL_COMMUNITY): Payer: BC Managed Care – PPO

## 2023-04-05 LAB — CBC WITH DIFFERENTIAL/PLATELET
Abs Immature Granulocytes: 0.04 10*3/uL (ref 0.00–0.07)
Basophils Absolute: 0 10*3/uL (ref 0.0–0.1)
Basophils Relative: 0 %
Eosinophils Absolute: 0 10*3/uL (ref 0.0–0.5)
Eosinophils Relative: 0 %
HCT: 39.1 % (ref 36.0–46.0)
Hemoglobin: 12.6 g/dL (ref 12.0–15.0)
Immature Granulocytes: 0 %
Lymphocytes Relative: 22 %
Lymphs Abs: 2.1 10*3/uL (ref 0.7–4.0)
MCH: 26.1 pg (ref 26.0–34.0)
MCHC: 32.2 g/dL (ref 30.0–36.0)
MCV: 81.1 fL (ref 80.0–100.0)
Monocytes Absolute: 0.5 10*3/uL (ref 0.1–1.0)
Monocytes Relative: 6 %
Neutro Abs: 6.9 10*3/uL (ref 1.7–7.7)
Neutrophils Relative %: 72 %
Platelets: 488 10*3/uL — ABNORMAL HIGH (ref 150–400)
RBC: 4.82 MIL/uL (ref 3.87–5.11)
RDW: 14.7 % (ref 11.5–15.5)
WBC: 9.5 10*3/uL (ref 4.0–10.5)
nRBC: 0 % (ref 0.0–0.2)

## 2023-04-05 LAB — COMPREHENSIVE METABOLIC PANEL
ALT: 13 U/L (ref 0–44)
AST: 15 U/L (ref 15–41)
Albumin: 4.1 g/dL (ref 3.5–5.0)
Alkaline Phosphatase: 95 U/L (ref 38–126)
Anion gap: 12 (ref 5–15)
BUN: 8 mg/dL (ref 6–20)
CO2: 21 mmol/L — ABNORMAL LOW (ref 22–32)
Calcium: 8.8 mg/dL — ABNORMAL LOW (ref 8.9–10.3)
Chloride: 105 mmol/L (ref 98–111)
Creatinine, Ser: 0.97 mg/dL (ref 0.44–1.00)
GFR, Estimated: >60 mL/min (ref >60)
Glucose, Bld: 102 mg/dL — ABNORMAL HIGH (ref 70–99)
Potassium: 3.6 mmol/L (ref 3.5–5.1)
Sodium: 138 mmol/L (ref 135–145)
Total Bilirubin: 0.5 mg/dL (ref 0.3–1.2)
Total Protein: 8 g/dL (ref 6.5–8.1)

## 2023-04-05 LAB — I-STAT BETA HCG BLOOD, ED (MC, WL, AP ONLY): I-stat hCG, quantitative: <5 m[IU]/mL (ref <5)

## 2023-04-05 MED ORDER — OXYCODONE-ACETAMINOPHEN 5-325 MG PO TABS
1.0000 | ORAL_TABLET | Freq: Once | ORAL | Status: AC
  Administered 2023-04-05: 1 via ORAL
  Filled 2023-04-05: qty 1

## 2023-04-05 MED ORDER — ONDANSETRON 4 MG PO TBDP
4.0000 mg | ORAL_TABLET | Freq: Once | ORAL | Status: AC
  Administered 2023-04-05: 4 mg via ORAL
  Filled 2023-04-05: qty 1

## 2023-04-05 MED ORDER — CYCLOBENZAPRINE HCL 10 MG PO TABS: 10.0000 mg | ORAL_TABLET | Freq: Two times a day (BID) | ORAL | 0 refills | Status: DC | PRN

## 2023-04-05 MED ORDER — CYCLOBENZAPRINE HCL 10 MG PO TABS
5.0000 mg | ORAL_TABLET | Freq: Once | ORAL | Status: AC
  Administered 2023-04-05: 5 mg via ORAL
  Filled 2023-04-05: qty 1

## 2023-04-05 MED ORDER — MORPHINE SULFATE (PF) 4 MG/ML IV SOLN
4.0000 mg | Freq: Once | INTRAVENOUS | Status: DC
  Filled 2023-04-05: qty 1

## 2023-04-05 MED ORDER — ONDANSETRON HCL 4 MG/2ML IJ SOLN
4.0000 mg | Freq: Once | INTRAMUSCULAR | Status: DC
  Filled 2023-04-05: qty 2
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
## 2023-04-07 ENCOUNTER — Telehealth: Payer: Self-pay
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
## 2023-04-14 ENCOUNTER — Ambulatory Visit (HOSPITAL_COMMUNITY)
Admission: RE | Admit: 2023-04-14 | Discharge: 2023-04-14 | Disposition: A | Discharge status: Home / Self Care | Payer: BC Managed Care – PPO | Source: Ambulatory Visit | Attending: Cardiovascular Disease | Admitting: Cardiovascular Disease

## 2023-04-14 ENCOUNTER — Encounter: Payer: Self-pay | Admitting: Nurse Practitioner

## 2023-04-14 ENCOUNTER — Ambulatory Visit: Payer: BC Managed Care – PPO | Admitting: Nurse Practitioner

## 2023-04-14 VITALS — BP 124/82 | HR 87 | Wt 219.0 lb

## 2023-04-14 DIAGNOSIS — M79662 Pain in left lower leg: Secondary | ICD-10-CM | POA: Insufficient documentation

## 2023-04-14 DIAGNOSIS — R600 Localized edema: Secondary | ICD-10-CM | POA: Insufficient documentation

## 2023-04-14 DIAGNOSIS — M25561 Pain in right knee: Secondary | ICD-10-CM

## 2023-04-14 DIAGNOSIS — M79672 Pain in left foot: Secondary | ICD-10-CM | POA: Insufficient documentation

## 2023-04-14 HISTORY — DX: Localized edema: R60.0

## 2023-04-14 HISTORY — DX: Pain in left foot: M79.672

## 2023-04-14 HISTORY — DX: Pain in right knee: M25.561

## 2023-04-14 MED ORDER — IBUPROFEN 800 MG PO TABS
800.0000 mg | ORAL_TABLET | Freq: Three times a day (TID) | ORAL | 2 refills | Status: DC | PRN
Start: 2023-04-14 — End: 2024-03-07
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
## 2023-04-14 NOTE — Patient Instructions (Signed)
I have sent the referral to orthopedics for you to be evaluated. I sent this urgent and hopefully we will get you in tomorrow.
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
## 2023-04-15 ENCOUNTER — Other Ambulatory Visit (INDEPENDENT_AMBULATORY_CARE_PROVIDER_SITE_OTHER): Payer: BC Managed Care – PPO

## 2023-04-15 ENCOUNTER — Ambulatory Visit: Payer: BC Managed Care – PPO | Admitting: Physician Assistant

## 2023-04-15 ENCOUNTER — Encounter: Payer: Self-pay | Admitting: Physician Assistant

## 2023-04-15 DIAGNOSIS — M25572 Pain in left ankle and joints of left foot: Secondary | ICD-10-CM

## 2023-04-15 DIAGNOSIS — M79672 Pain in left foot: Secondary | ICD-10-CM

## 2023-04-15 DIAGNOSIS — S8001XA Contusion of right knee, initial encounter: Secondary | ICD-10-CM | POA: Diagnosis not present

## 2023-04-15 HISTORY — DX: Contusion of right knee, initial encounter: S80.01XA

## 2023-04-15 HISTORY — DX: Pain in left ankle and joints of left foot: M25.572
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
## 2023-05-06 ENCOUNTER — Ambulatory Visit: Payer: BC Managed Care – PPO | Admitting: Physician Assistant

## 2023-05-14 ENCOUNTER — Ambulatory Visit: Payer: BC Managed Care – PPO | Admitting: Physician Assistant

## 2023-07-21 ENCOUNTER — Other Ambulatory Visit: Payer: BC Managed Care – PPO

## 2023-07-30 ENCOUNTER — Encounter: Payer: Self-pay | Admitting: Hematology

## 2023-07-30 ENCOUNTER — Other Ambulatory Visit (HOSPITAL_BASED_OUTPATIENT_CLINIC_OR_DEPARTMENT_OTHER): Payer: Self-pay

## 2023-07-30 MED ORDER — WEGOVY 0.25 MG/0.5ML ~~LOC~~ SOAJ
0.2500 mg | SUBCUTANEOUS | 0 refills | Status: DC
  Filled 2023-07-30: qty 2, 28d supply, fill #0

## 2023-08-11 ENCOUNTER — Other Ambulatory Visit (HOSPITAL_BASED_OUTPATIENT_CLINIC_OR_DEPARTMENT_OTHER): Payer: Self-pay

## 2023-09-30 ENCOUNTER — Encounter: Payer: Self-pay | Admitting: Psychiatry

## 2023-11-16 ENCOUNTER — Encounter: Payer: Self-pay | Admitting: Hematology

## 2024-01-21 ENCOUNTER — Encounter: Payer: BC Managed Care – PPO | Admitting: Nurse Practitioner

## 2024-01-28 ENCOUNTER — Encounter: Payer: Self-pay | Admitting: Hematology

## 2024-01-28 ENCOUNTER — Other Ambulatory Visit (HOSPITAL_BASED_OUTPATIENT_CLINIC_OR_DEPARTMENT_OTHER): Payer: Self-pay

## 2024-01-28 MED ORDER — ZEPBOUND 2.5 MG/0.5ML ~~LOC~~ SOAJ
2.5000 mg | SUBCUTANEOUS | 0 refills | Status: DC
  Filled 2024-01-28: qty 2, 28d supply, fill #0

## 2024-01-28 MED ORDER — PHENTERMINE HCL 37.5 MG PO TABS
ORAL_TABLET | ORAL | 0 refills | Status: DC
  Filled 2024-01-28: qty 30, 30d supply, fill #0

## 2024-01-28 MED ORDER — ERGOCALCIFEROL 1.25 MG (50000 UT) PO CAPS
50000.0000 [IU] | ORAL_CAPSULE | ORAL | 0 refills | Status: AC
  Filled 2024-01-28: qty 12, 84d supply, fill #0

## 2024-01-28 MED ORDER — TOPIRAMATE 50 MG PO TABS
50.0000 mg | ORAL_TABLET | Freq: Every day | ORAL | 0 refills | Status: DC
  Filled 2024-01-28: qty 30, 30d supply, fill #0

## 2024-01-28 MED ORDER — IBUPROFEN 800 MG PO TABS
800.0000 mg | ORAL_TABLET | Freq: Two times a day (BID) | ORAL | 0 refills | Status: DC
  Filled 2024-01-28: qty 20, 10d supply, fill #0

## 2024-01-29 ENCOUNTER — Other Ambulatory Visit: Payer: Self-pay

## 2024-02-01 ENCOUNTER — Other Ambulatory Visit (HOSPITAL_BASED_OUTPATIENT_CLINIC_OR_DEPARTMENT_OTHER): Payer: Self-pay

## 2024-02-09 ENCOUNTER — Other Ambulatory Visit (HOSPITAL_BASED_OUTPATIENT_CLINIC_OR_DEPARTMENT_OTHER): Payer: Self-pay

## 2024-03-07 ENCOUNTER — Ambulatory Visit: Admitting: Nurse Practitioner

## 2024-03-07 ENCOUNTER — Encounter: Payer: Self-pay | Admitting: Hematology

## 2024-03-07 VITALS — BP 122/80 | HR 82 | Ht 62.0 in | Wt 224.0 lb

## 2024-03-07 DIAGNOSIS — Z1231 Encounter for screening mammogram for malignant neoplasm of breast: Secondary | ICD-10-CM | POA: Diagnosis not present

## 2024-03-07 DIAGNOSIS — E611 Iron deficiency: Secondary | ICD-10-CM

## 2024-03-07 DIAGNOSIS — Z0000 Encounter for general adult medical examination without abnormal findings: Secondary | ICD-10-CM | POA: Diagnosis not present

## 2024-03-07 DIAGNOSIS — Z6841 Body Mass Index (BMI) 40.0 and over, adult: Secondary | ICD-10-CM | POA: Diagnosis not present

## 2024-03-07 DIAGNOSIS — D241 Benign neoplasm of right breast: Secondary | ICD-10-CM

## 2024-03-07 DIAGNOSIS — I4711 Inappropriate sinus tachycardia, so stated: Secondary | ICD-10-CM

## 2024-03-07 DIAGNOSIS — E559 Vitamin D deficiency, unspecified: Secondary | ICD-10-CM

## 2024-03-07 DIAGNOSIS — B360 Pityriasis versicolor: Secondary | ICD-10-CM

## 2024-03-07 DIAGNOSIS — E782 Mixed hyperlipidemia: Secondary | ICD-10-CM

## 2024-03-07 DIAGNOSIS — R52 Pain, unspecified: Secondary | ICD-10-CM

## 2024-03-07 DIAGNOSIS — R6882 Decreased libido: Secondary | ICD-10-CM

## 2024-03-07 LAB — LIPID PANEL

## 2024-03-07 MED ORDER — IBUPROFEN 800 MG PO TABS
800.0000 mg | ORAL_TABLET | Freq: Three times a day (TID) | ORAL | 3 refills | Status: AC | PRN
Start: 2024-03-07 — End: ?

## 2024-03-07 MED ORDER — KETOCONAZOLE 2 % EX CREA
1.0000 | TOPICAL_CREAM | Freq: Every day | CUTANEOUS | 3 refills | Status: AC
Start: 2024-03-07 — End: ?

## 2024-03-07 MED ORDER — TRIAMCINOLONE ACETONIDE 0.1 % EX CREA
1.0000 | TOPICAL_CREAM | Freq: Two times a day (BID) | CUTANEOUS | 0 refills | Status: AC
Start: 2024-03-07 — End: ?

## 2024-03-07 MED ORDER — KETOCONAZOLE 2 % EX SHAM
1.0000 | MEDICATED_SHAMPOO | CUTANEOUS | 3 refills | Status: AC
Start: 2024-03-07 — End: ?

## 2024-03-07 MED ORDER — PHENTERMINE HCL 37.5 MG PO TABS
37.5000 mg | ORAL_TABLET | Freq: Every day | ORAL | 0 refills | Status: AC
Start: 2024-03-07 — End: ?

## 2024-03-07 MED ORDER — TOPIRAMATE 50 MG PO TABS
50.0000 mg | ORAL_TABLET | Freq: Two times a day (BID) | ORAL | 3 refills | Status: AC
Start: 2024-03-07 — End: ?
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
## 2024-03-08 ENCOUNTER — Encounter: Payer: Self-pay | Admitting: Hematology

## 2024-03-08 ENCOUNTER — Other Ambulatory Visit (HOSPITAL_COMMUNITY): Payer: Self-pay

## 2024-03-08 ENCOUNTER — Telehealth: Payer: Self-pay

## 2024-03-08 DIAGNOSIS — E1165 Type 2 diabetes mellitus with hyperglycemia: Secondary | ICD-10-CM | POA: Insufficient documentation

## 2024-03-08 DIAGNOSIS — R6882 Decreased libido: Secondary | ICD-10-CM | POA: Insufficient documentation

## 2024-03-08 LAB — COMPREHENSIVE METABOLIC PANEL WITH GFR
ALT: 12 IU/L (ref 0–32)
AST: 16 IU/L (ref 0–40)
Albumin: 4.5 g/dL (ref 3.9–4.9)
Alkaline Phosphatase: 145 IU/L — ABNORMAL HIGH (ref 44–121)
BUN/Creatinine Ratio: 14 (ref 9–23)
BUN: 11 mg/dL (ref 6–20)
Bilirubin Total: 0.2 mg/dL (ref 0.0–1.2)
CO2: 23 mmol/L (ref 20–29)
Calcium: 9.7 mg/dL (ref 8.7–10.2)
Chloride: 102 mmol/L (ref 96–106)
Creatinine, Ser: 0.76 mg/dL (ref 0.57–1.00)
Globulin, Total: 3.1 g/dL (ref 1.5–4.5)
Glucose: 80 mg/dL (ref 70–99)
Potassium: 3.7 mmol/L (ref 3.5–5.2)
Sodium: 140 mmol/L (ref 134–144)
Total Protein: 7.6 g/dL (ref 6.0–8.5)
eGFR: 104 mL/min/{1.73_m2} (ref >59)

## 2024-03-08 LAB — IRON,TIBC AND FERRITIN PANEL
Ferritin: 19 ng/mL (ref 15–150)
Iron Saturation: 13 % — ABNORMAL LOW (ref 15–55)
Iron: 47 ug/dL (ref 27–159)
Total Iron Binding Capacity: 357 ug/dL (ref 250–450)
UIBC: 310 ug/dL (ref 131–425)

## 2024-03-08 LAB — CBC WITH DIFFERENTIAL/PLATELET
Basophils Absolute: 0 10*3/uL (ref 0.0–0.2)
Basos: 1 %
EOS (ABSOLUTE): 0.1 10*3/uL (ref 0.0–0.4)
Eos: 2 %
Hematocrit: 35.8 % (ref 34.0–46.6)
Hemoglobin: 11.1 g/dL (ref 11.1–15.9)
Immature Grans (Abs): 0 10*3/uL (ref 0.0–0.1)
Immature Granulocytes: 0 %
Lymphocytes Absolute: 1.9 10*3/uL (ref 0.7–3.1)
Lymphs: 31 %
MCH: 23.5 pg — ABNORMAL LOW (ref 26.6–33.0)
MCHC: 31 g/dL — ABNORMAL LOW (ref 31.5–35.7)
MCV: 76 fL — ABNORMAL LOW (ref 79–97)
Monocytes Absolute: 0.4 10*3/uL (ref 0.1–0.9)
Monocytes: 7 %
Neutrophils Absolute: 3.7 10*3/uL (ref 1.4–7.0)
Neutrophils: 59 %
Platelets: 500 10*3/uL — ABNORMAL HIGH (ref 150–450)
RBC: 4.73 x10E6/uL (ref 3.77–5.28)
RDW: 15.6 % — ABNORMAL HIGH (ref 11.7–15.4)
WBC: 6.2 10*3/uL (ref 3.4–10.8)

## 2024-03-08 LAB — LIPID PANEL
Cholesterol, Total: 176 mg/dL (ref 100–199)
HDL: 52 mg/dL (ref >39)
LDL CALC COMMENT:: 3.4 ratio (ref 0.0–4.4)
LDL Chol Calc (NIH): 113 mg/dL — ABNORMAL HIGH (ref 0–99)
Triglycerides: 57 mg/dL (ref 0–149)
VLDL Cholesterol Cal: 11 mg/dL (ref 5–40)

## 2024-03-08 LAB — VITAMIN D 25 HYDROXY (VIT D DEFICIENCY, FRACTURES): Vit D, 25-Hydroxy: 22.1 ng/mL — ABNORMAL LOW (ref 30.0–100.0)

## 2024-03-08 LAB — HEMOGLOBIN A1C
Est. average glucose Bld gHb Est-mCnc: 103 mg/dL
Hgb A1c MFr Bld: 5.2 % (ref 4.8–5.6)

## 2024-03-08 NOTE — Telephone Encounter (Signed)
 Pharmacy Patient Advocate Encounter  Received notification from [facility] that Prior Authorization for Phentermine  HCl 37.5MG  tablets has been APPROVED from 4.22.25 to 7.21.25. Ran test claim, Copay is $7.51. This test claim was processed through [facility]- copay amounts may vary at other pharmacies due to pharmacy/plan contracts, or as the patient moves through the different stages of their insurance plan.   PA #/Case ID/Reference #: (Key: [id number])

## 2024-03-11 ENCOUNTER — Encounter: Payer: Self-pay | Admitting: Nurse Practitioner

## 2024-03-14 NOTE — Assessment & Plan Note (Signed)
 She presents with pruritus and fine bumps primarily on the arms, present for several weeks to a month. Tinea versicolor is suspected and confirmed by the presence of a glow under a Wood's lamp. The condition is a skin fungus that is easy to treat but may take up to six weeks for full resolution of symptoms. - Prescribe ketoconazole  shampoo to be used as a body wash 2-3 times a week - Prescribe ketoconazole  cream for daily use on affected areas - Prescribe triamcinolone  cream for pruritus relief - Advise to follow up if symptoms do not improve

## 2024-03-14 NOTE — Assessment & Plan Note (Signed)
 She is due for a mammogram and follow-up breast biopsy due to fibroadenoma in the right breast. She has not had regular follow-ups at the breast center as previously recommended. The breast center will review previous records to determine the next steps. - Schedule mammogram at the breast center - Send referral to the breast center for mammogram and potential biopsy

## 2024-03-14 NOTE — Assessment & Plan Note (Signed)
 Not currently managed with statin therapy. Will recheck labs and determine current need. Diet and exercise recommendations provided.

## 2024-03-14 NOTE — Assessment & Plan Note (Addendum)
 Chronic concerns with weight management discussed today. Patient meets criteria for management for chronic obesity given documented BMI of 40.97 with hyperlipidemia and elevated blood sugars. Recommend intensive diet and exercise regimen to aid in weight management. We discussed this today and information provided on AVS. Will obtain labs to determine if there are any factors that are medically contributing. Insurance will not cover wegovy  or zepbound . Will make changes based on findings.

## 2024-03-14 NOTE — Assessment & Plan Note (Signed)

## 2024-03-14 NOTE — Assessment & Plan Note (Signed)
 HRRR today with no alarm symptoms present at this time. Caution with phentermine  use to avoid elevation in HR.

## 2024-03-14 NOTE — Assessment & Plan Note (Signed)
 She reports experiencing significantly reduced libido and inquires about treatment options. Bupropion is sometimes used to counteract low libido associated with antidepressant use, but other FDA-approved medications for low libido are not familiar to the provider. - Research medications for low libido and consider prescribing options

## 2024-03-21 ENCOUNTER — Ambulatory Visit: Admitting: Podiatry

## 2024-05-11 ENCOUNTER — Other Ambulatory Visit (HOSPITAL_COMMUNITY): Payer: Self-pay

## 2024-07-19 ENCOUNTER — Other Ambulatory Visit: Payer: Self-pay | Admitting: Nurse Practitioner

## 2024-07-19 DIAGNOSIS — Z6841 Body Mass Index (BMI) 40.0 and over, adult: Secondary | ICD-10-CM

## 2024-07-20 NOTE — Telephone Encounter (Signed)
 Last apt 03/07/24.

## 2024-11-03 ENCOUNTER — Encounter (HOSPITAL_COMMUNITY): Payer: Self-pay | Admitting: *Deleted

## 2025-03-09 ENCOUNTER — Encounter: Payer: Self-pay | Admitting: Nurse Practitioner
